# Patient Record
Sex: Male | Born: 1940 | Race: White | Hispanic: No | Marital: Married | Smoking: Former smoker
Health system: Southern US, Community
[De-identification: ages and names within clinical notes are randomized; demographics above are authoritative.]

## PROBLEM LIST (undated history)

## (undated) DIAGNOSIS — E119 Type 2 diabetes mellitus without complications: Secondary | ICD-10-CM

## (undated) DIAGNOSIS — I10 Essential (primary) hypertension: Secondary | ICD-10-CM

## (undated) DIAGNOSIS — M1990 Unspecified osteoarthritis, unspecified site: Secondary | ICD-10-CM

## (undated) DIAGNOSIS — I739 Peripheral vascular disease, unspecified: Secondary | ICD-10-CM

## (undated) DIAGNOSIS — Z8614 Personal history of Methicillin resistant Staphylococcus aureus infection: Secondary | ICD-10-CM

## (undated) HISTORY — PX: ROTATOR CUFF REPAIR: SHX139

## (undated) HISTORY — PX: BACK SURGERY: SHX140

## (undated) HISTORY — PX: VEIN SURGERY: SHX48

## (undated) HISTORY — PX: REPLACEMENT TOTAL KNEE: SUR1224

---

## 2001-05-26 ENCOUNTER — Encounter (HOSPITAL_COMMUNITY): Admission: RE | Admit: 2001-05-26 | Discharge: 2001-06-25 | Payer: Self-pay | Admitting: Rheumatology

## 2001-08-18 ENCOUNTER — Encounter (HOSPITAL_COMMUNITY): Admission: RE | Admit: 2001-08-18 | Discharge: 2001-09-17 | Payer: Self-pay | Admitting: Rheumatology

## 2001-11-10 ENCOUNTER — Encounter (HOSPITAL_COMMUNITY): Admission: RE | Admit: 2001-11-10 | Discharge: 2001-12-10 | Payer: Self-pay | Admitting: Rheumatology

## 2002-02-17 ENCOUNTER — Encounter (HOSPITAL_COMMUNITY): Admission: RE | Admit: 2002-02-17 | Discharge: 2002-03-19 | Payer: Self-pay | Admitting: Rheumatology

## 2002-03-30 ENCOUNTER — Encounter (HOSPITAL_COMMUNITY): Admission: RE | Admit: 2002-03-30 | Discharge: 2002-04-29 | Payer: Self-pay | Admitting: Rheumatology

## 2002-07-20 ENCOUNTER — Encounter (HOSPITAL_COMMUNITY): Admission: RE | Admit: 2002-07-20 | Discharge: 2002-08-19 | Payer: Self-pay | Admitting: Rheumatology

## 2002-10-12 ENCOUNTER — Encounter (HOSPITAL_COMMUNITY): Admission: RE | Admit: 2002-10-12 | Discharge: 2002-11-11 | Payer: Self-pay | Admitting: Rheumatology

## 2002-11-27 ENCOUNTER — Encounter (HOSPITAL_COMMUNITY): Admission: RE | Admit: 2002-11-27 | Discharge: 2002-12-27 | Payer: Self-pay | Admitting: Rheumatology

## 2003-01-12 ENCOUNTER — Encounter (HOSPITAL_COMMUNITY): Admission: RE | Admit: 2003-01-12 | Discharge: 2003-02-11 | Payer: Self-pay | Admitting: Rheumatology

## 2003-05-18 ENCOUNTER — Encounter (HOSPITAL_COMMUNITY): Admission: RE | Admit: 2003-05-18 | Discharge: 2003-06-17 | Payer: Self-pay | Admitting: Family Medicine

## 2003-08-17 ENCOUNTER — Encounter (HOSPITAL_COMMUNITY): Admission: RE | Admit: 2003-08-17 | Discharge: 2003-09-16 | Payer: Self-pay | Admitting: Rheumatology

## 2006-10-06 ENCOUNTER — Encounter: Admission: RE | Admit: 2006-10-06 | Discharge: 2006-10-06 | Payer: Self-pay | Admitting: Specialist

## 2006-11-17 ENCOUNTER — Inpatient Hospital Stay (HOSPITAL_COMMUNITY): Admission: RE | Admit: 2006-11-17 | Discharge: 2006-11-19 | Payer: Self-pay | Admitting: Specialist

## 2007-05-13 IMAGING — CR DG LUMBAR SPINE 2-3V
3 series · 3 of 3 positions shown · non-contrast
Comparison: CT myelogram 10/06/06.

CLINICAL DATA: Preop stenosis.
 LUMBAR SPINE ? 2 VIEW:

[t l-spine a.p.]
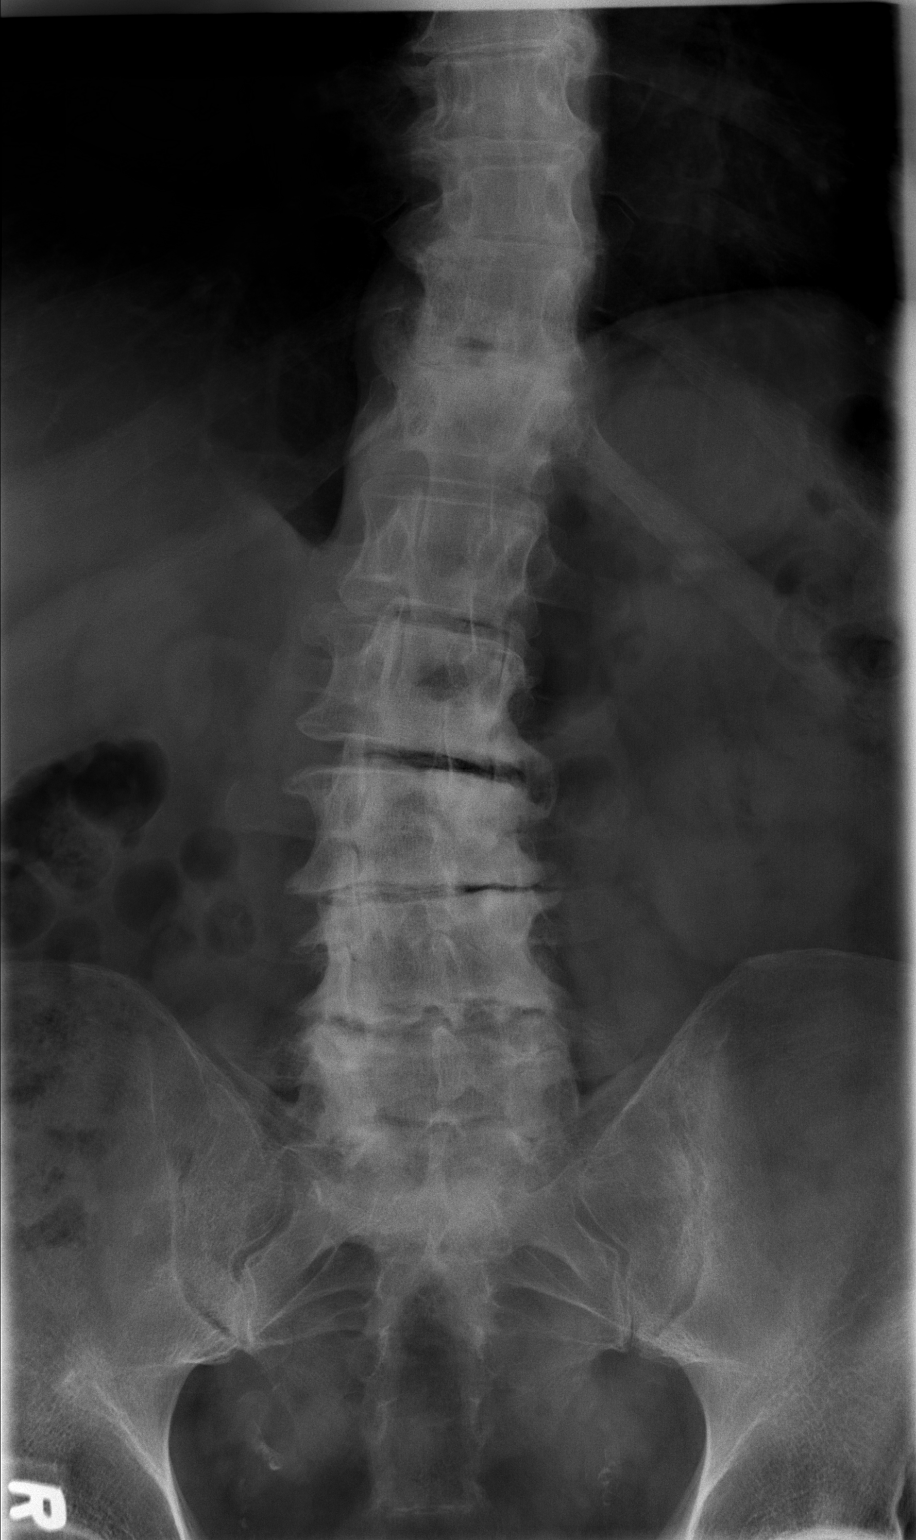

[t l-spine lat]
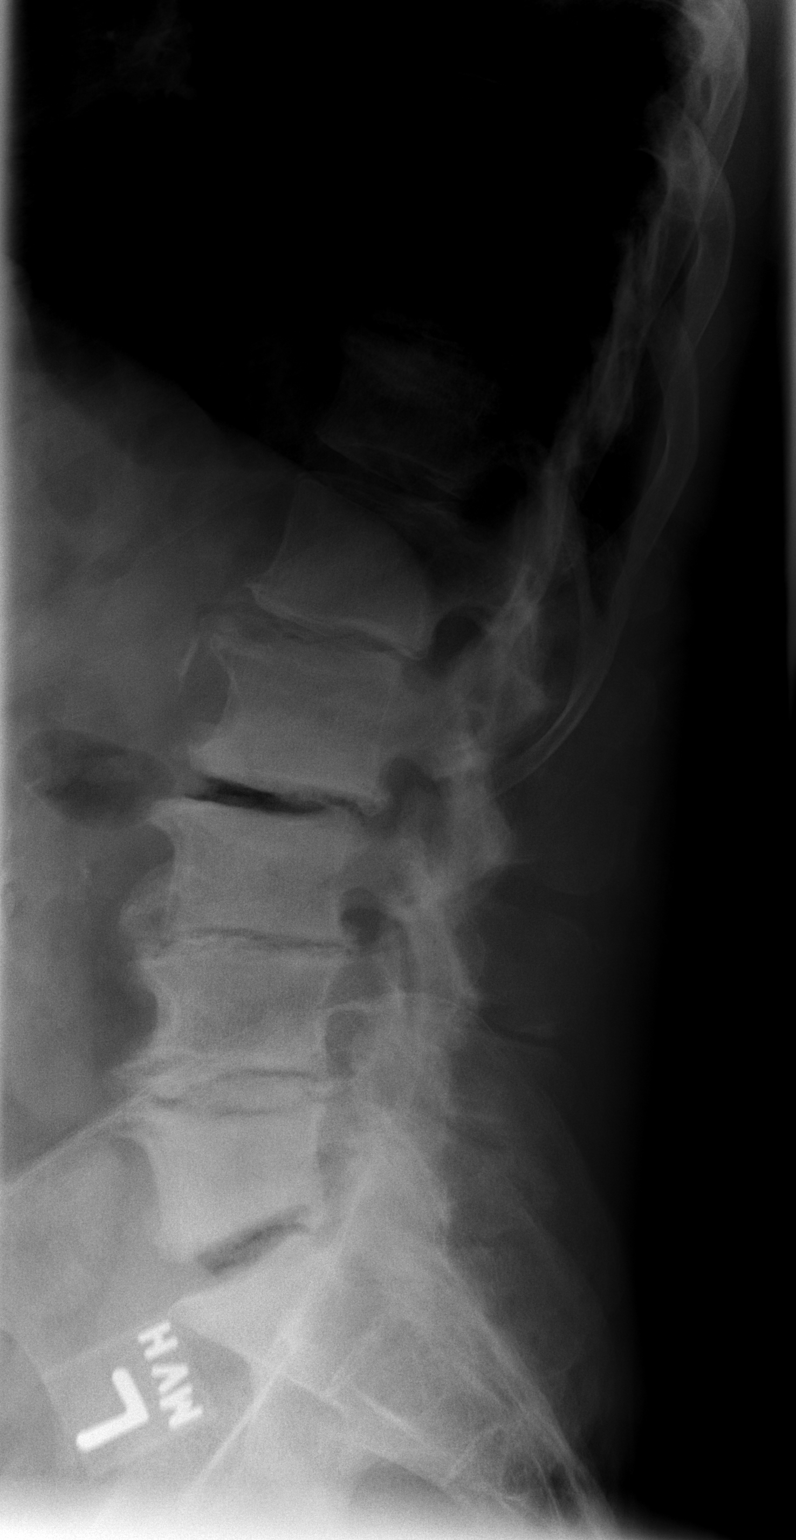

[t l-spine l5-s1 spot]
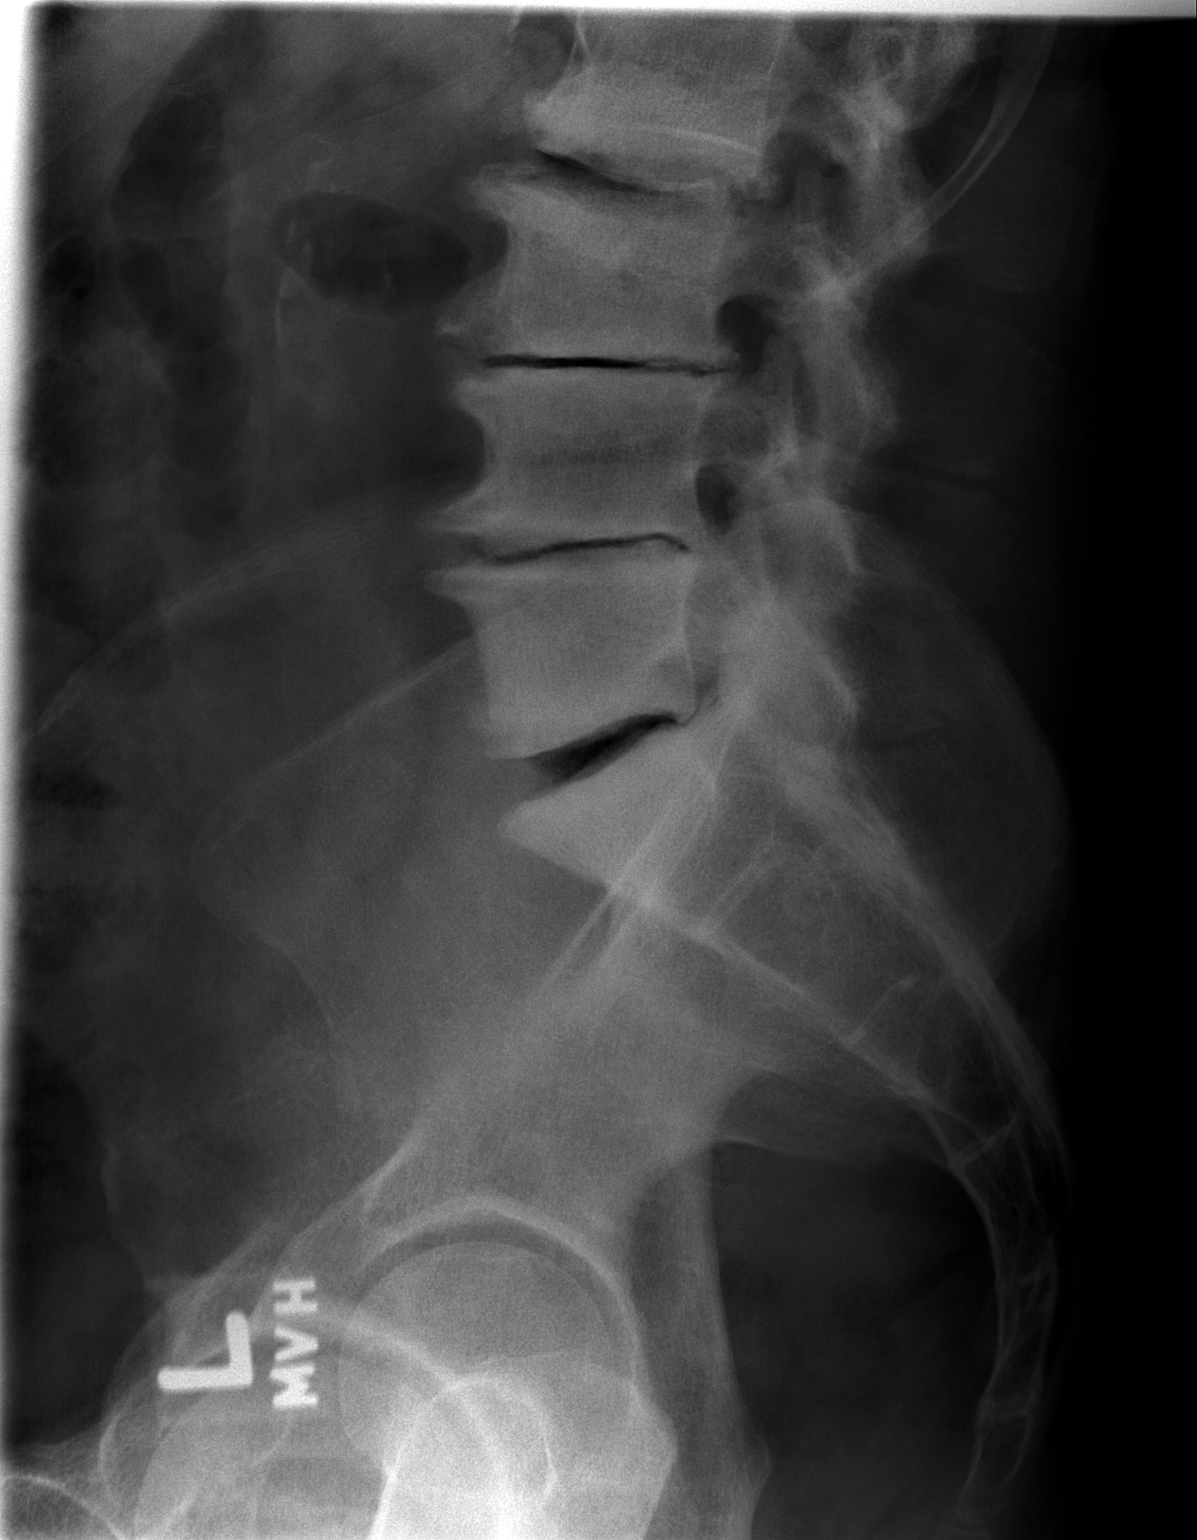

[3 of 3 positions shown; findings below may reference images not displayed]

FINDINGS: Minimal retrolisthesis at the L1-2, L2-3, and L5-S1.  There are marked endplate degenerative changes throughout with loss of disk space height and vacuum disk phenomenon.  Facet hypertrophy is seen as well. Mild right curvature is seen.
IMPRESSION: Severe spondylosis.

## 2010-12-21 ENCOUNTER — Encounter: Payer: Self-pay | Admitting: Family Medicine

## 2012-03-01 ENCOUNTER — Ambulatory Visit: Payer: Self-pay | Admitting: Physical Therapy

## 2012-03-07 ENCOUNTER — Ambulatory Visit: Payer: Medicare Other | Attending: Family Medicine | Admitting: Physical Therapy

## 2012-03-07 DIAGNOSIS — R5381 Other malaise: Secondary | ICD-10-CM | POA: Insufficient documentation

## 2012-03-07 DIAGNOSIS — M545 Low back pain, unspecified: Secondary | ICD-10-CM | POA: Insufficient documentation

## 2012-03-07 DIAGNOSIS — M2560 Stiffness of unspecified joint, not elsewhere classified: Secondary | ICD-10-CM | POA: Insufficient documentation

## 2012-03-07 DIAGNOSIS — IMO0001 Reserved for inherently not codable concepts without codable children: Secondary | ICD-10-CM | POA: Insufficient documentation

## 2012-03-11 ENCOUNTER — Ambulatory Visit: Payer: Medicare Other | Admitting: Physical Therapy

## 2012-08-08 ENCOUNTER — Other Ambulatory Visit: Payer: Self-pay | Admitting: Rheumatology

## 2012-08-08 DIAGNOSIS — IMO0002 Reserved for concepts with insufficient information to code with codable children: Secondary | ICD-10-CM

## 2012-08-11 ENCOUNTER — Other Ambulatory Visit: Payer: Self-pay | Admitting: Rheumatology

## 2012-08-11 ENCOUNTER — Ambulatory Visit
Admission: RE | Admit: 2012-08-11 | Discharge: 2012-08-11 | Disposition: A | Discharge status: Home / Self Care | Payer: Medicare Other | Source: Ambulatory Visit | Attending: Rheumatology | Admitting: Rheumatology

## 2012-08-11 DIAGNOSIS — IMO0002 Reserved for concepts with insufficient information to code with codable children: Secondary | ICD-10-CM

## 2012-08-15 ENCOUNTER — Other Ambulatory Visit: Payer: Self-pay | Admitting: Rheumatology

## 2012-08-15 DIAGNOSIS — M79651 Pain in right thigh: Secondary | ICD-10-CM

## 2012-08-15 DIAGNOSIS — M5416 Radiculopathy, lumbar region: Secondary | ICD-10-CM

## 2012-08-17 ENCOUNTER — Ambulatory Visit
Admission: RE | Admit: 2012-08-17 | Discharge: 2012-08-17 | Disposition: A | Discharge status: Home / Self Care | Payer: Medicare Other | Source: Ambulatory Visit | Attending: Rheumatology | Admitting: Rheumatology

## 2012-08-17 VITALS — BP 183/86 | HR 79 | Ht 72.0 in | Wt 210.0 lb

## 2012-08-17 DIAGNOSIS — M5416 Radiculopathy, lumbar region: Secondary | ICD-10-CM

## 2012-08-17 DIAGNOSIS — M79651 Pain in right thigh: Secondary | ICD-10-CM

## 2012-08-17 MED ORDER — IOHEXOL 180 MG/ML  SOLN
1.0000 mL | Freq: Once | INTRAMUSCULAR | Status: AC | PRN
  Administered 2012-08-17: 1 mL via EPIDURAL

## 2012-08-17 MED ORDER — METHYLPREDNISOLONE ACETATE 40 MG/ML INJ SUSP (RADIOLOG
120.0000 mg | Freq: Once | INTRAMUSCULAR | Status: AC
  Administered 2012-08-17: 120 mg via EPIDURAL

## 2012-09-14 ENCOUNTER — Encounter (HOSPITAL_BASED_OUTPATIENT_CLINIC_OR_DEPARTMENT_OTHER): Payer: Medicare Other | Attending: General Surgery

## 2012-09-14 DIAGNOSIS — E119 Type 2 diabetes mellitus without complications: Secondary | ICD-10-CM | POA: Insufficient documentation

## 2012-09-14 DIAGNOSIS — L97409 Non-pressure chronic ulcer of unspecified heel and midfoot with unspecified severity: Secondary | ICD-10-CM | POA: Insufficient documentation

## 2012-09-14 DIAGNOSIS — Z79899 Other long term (current) drug therapy: Secondary | ICD-10-CM | POA: Insufficient documentation

## 2012-09-14 DIAGNOSIS — L97209 Non-pressure chronic ulcer of unspecified calf with unspecified severity: Secondary | ICD-10-CM | POA: Insufficient documentation

## 2012-09-14 DIAGNOSIS — Z794 Long term (current) use of insulin: Secondary | ICD-10-CM | POA: Insufficient documentation

## 2012-09-14 NOTE — Progress Notes (Signed)
[facility]  NAME:  Craig Woodard, Craig Woodard NO.:  [id number]  MEDICAL RECORD NO.:  [id number]      DATE OF BIRTH:  10-Sep-1941  PHYSICIAN:  Ardath Sax, M.D.           VISIT DATE:                                  OFFICE VISIT   He is a 71 year old diabetic man who comes to [place] because of a persistent ulcer on his right heel and one also on his right calf.  The one on his heel is about 2.5 cm in diameter.  The one on his calf is a dry area that looks like a scab on it.  It is only 0.6 cm in diameter.  The leg has been x-rayed and there is no findings to make [place] worry about any osteomyelitis.  He has no fever.  His blood pressure is 160/90, temperature 98.6, pulse is 80.  He weighs 212 pounds.  He feels like this ulcer came because of ill fitting pair of shoes and he developed a blister and now he has frank ulcers going down in the subcutaneous tissue.  Today, I debrided this with a knife and pickups and I got it down to some bleeding tissue, and we are planning on to treat it with Santyl for now and I really did not feel any pulses.  So we are getting vascular studies on him and we are going to apply for a Dermagraft and we will see what we can do with a Dermagraft if we get a clean enough with the Santyl and multiple debridements.  He is on quite a few medicines including glipizide for his diabetes and Actos and metformin. He takes carvedilol for his blood pressure, also benazepril and amlodipine and he takes insulin also along with Lipitor.  He also takes hydrocodone for pain.  So we are going to see him back here in a week. I know he will need further debridement and hopefully will get him okayed for Dermagraft which we can may be used in another week or two.     Ardath Sax, M.D.     PP/MEDQ  D:  09/14/2012  T:  09/14/2012  Job:  [id number]

## 2012-10-05 ENCOUNTER — Encounter (HOSPITAL_BASED_OUTPATIENT_CLINIC_OR_DEPARTMENT_OTHER): Payer: Medicare Other

## 2012-10-05 ENCOUNTER — Encounter (HOSPITAL_BASED_OUTPATIENT_CLINIC_OR_DEPARTMENT_OTHER): Payer: Medicare Other | Attending: General Surgery

## 2012-10-05 DIAGNOSIS — L97809 Non-pressure chronic ulcer of other part of unspecified lower leg with unspecified severity: Secondary | ICD-10-CM | POA: Insufficient documentation

## 2012-10-05 DIAGNOSIS — L97409 Non-pressure chronic ulcer of unspecified heel and midfoot with unspecified severity: Secondary | ICD-10-CM | POA: Insufficient documentation

## 2012-10-05 DIAGNOSIS — E1169 Type 2 diabetes mellitus with other specified complication: Secondary | ICD-10-CM | POA: Insufficient documentation

## 2012-10-19 ENCOUNTER — Encounter (HOSPITAL_BASED_OUTPATIENT_CLINIC_OR_DEPARTMENT_OTHER): Payer: Medicare Other

## 2012-11-02 ENCOUNTER — Encounter (HOSPITAL_BASED_OUTPATIENT_CLINIC_OR_DEPARTMENT_OTHER): Payer: Medicare Other | Attending: General Surgery

## 2012-11-02 DIAGNOSIS — L97409 Non-pressure chronic ulcer of unspecified heel and midfoot with unspecified severity: Secondary | ICD-10-CM | POA: Insufficient documentation

## 2012-11-02 DIAGNOSIS — L97809 Non-pressure chronic ulcer of other part of unspecified lower leg with unspecified severity: Secondary | ICD-10-CM | POA: Insufficient documentation

## 2012-11-02 DIAGNOSIS — E1169 Type 2 diabetes mellitus with other specified complication: Secondary | ICD-10-CM | POA: Insufficient documentation

## 2012-12-07 ENCOUNTER — Encounter (HOSPITAL_BASED_OUTPATIENT_CLINIC_OR_DEPARTMENT_OTHER): Payer: Medicare Other | Attending: General Surgery

## 2012-12-07 DIAGNOSIS — L97409 Non-pressure chronic ulcer of unspecified heel and midfoot with unspecified severity: Secondary | ICD-10-CM | POA: Insufficient documentation

## 2012-12-07 DIAGNOSIS — E1169 Type 2 diabetes mellitus with other specified complication: Secondary | ICD-10-CM | POA: Insufficient documentation

## 2012-12-07 DIAGNOSIS — L97809 Non-pressure chronic ulcer of other part of unspecified lower leg with unspecified severity: Secondary | ICD-10-CM | POA: Insufficient documentation

## 2013-01-04 ENCOUNTER — Encounter (HOSPITAL_BASED_OUTPATIENT_CLINIC_OR_DEPARTMENT_OTHER): Payer: Medicare Other | Attending: General Surgery

## 2013-01-04 DIAGNOSIS — L97509 Non-pressure chronic ulcer of other part of unspecified foot with unspecified severity: Secondary | ICD-10-CM | POA: Insufficient documentation

## 2013-01-04 DIAGNOSIS — E1169 Type 2 diabetes mellitus with other specified complication: Secondary | ICD-10-CM | POA: Insufficient documentation

## 2013-01-04 NOTE — Progress Notes (Signed)
[facility]  NAME:  LARONE, KLIETHERMES NO.:  [id number]  MEDICAL RECORD NO.:  [id number]      DATE OF BIRTH:  September 03, 1941  PHYSICIAN:  Ardath Sax, M.D.           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  We have done multiple debridements.  We have used Santyl.  We have used collagen.  We have used silver alginate.  We are offloading it.  Today, I just debrided it again, it is still about 3 cm in diameter and it worries me that is going close to bone.  I am going to put him on doxycycline and will continue to offload and I will treat him with silver alginate and Santyl, but I would like to apply for hyperbaric oxygen treatments that I think would be of great help and if we can get this cleaned up, I would also like to use Dermagraft.  So, we are applying for both HBO and Dermagraft and we will continue to treat him weekly here with Santyl and alginate and for this wound Wagner 3 diabetic foot ulcer.     Ardath Sax, M.D.     PP/MEDQ  D:  01/04/2013  T:  01/04/2013  Job:  [id number]

## 2013-01-11 ENCOUNTER — Encounter (HOSPITAL_BASED_OUTPATIENT_CLINIC_OR_DEPARTMENT_OTHER): Payer: Medicare Other

## 2013-02-01 ENCOUNTER — Encounter (HOSPITAL_BASED_OUTPATIENT_CLINIC_OR_DEPARTMENT_OTHER): Payer: Medicare Other | Attending: General Surgery

## 2013-02-01 DIAGNOSIS — E1169 Type 2 diabetes mellitus with other specified complication: Secondary | ICD-10-CM | POA: Insufficient documentation

## 2013-02-01 DIAGNOSIS — L97409 Non-pressure chronic ulcer of unspecified heel and midfoot with unspecified severity: Secondary | ICD-10-CM | POA: Insufficient documentation

## 2013-03-01 ENCOUNTER — Encounter (HOSPITAL_BASED_OUTPATIENT_CLINIC_OR_DEPARTMENT_OTHER): Payer: Medicare Other | Attending: General Surgery

## 2013-03-01 DIAGNOSIS — I96 Gangrene, not elsewhere classified: Secondary | ICD-10-CM | POA: Insufficient documentation

## 2013-03-01 DIAGNOSIS — E1159 Type 2 diabetes mellitus with other circulatory complications: Secondary | ICD-10-CM | POA: Insufficient documentation

## 2013-03-01 DIAGNOSIS — E1169 Type 2 diabetes mellitus with other specified complication: Secondary | ICD-10-CM | POA: Insufficient documentation

## 2013-03-01 DIAGNOSIS — L97409 Non-pressure chronic ulcer of unspecified heel and midfoot with unspecified severity: Secondary | ICD-10-CM | POA: Insufficient documentation

## 2013-03-01 DIAGNOSIS — L97509 Non-pressure chronic ulcer of other part of unspecified foot with unspecified severity: Secondary | ICD-10-CM | POA: Insufficient documentation

## 2013-03-07 ENCOUNTER — Other Ambulatory Visit: Payer: Self-pay | Admitting: *Deleted

## 2013-03-07 ENCOUNTER — Ambulatory Visit (INDEPENDENT_AMBULATORY_CARE_PROVIDER_SITE_OTHER): Payer: Medicare Other

## 2013-03-07 ENCOUNTER — Other Ambulatory Visit: Payer: Self-pay

## 2013-03-07 DIAGNOSIS — S91109D Unspecified open wound of unspecified toe(s) without damage to nail, subsequent encounter: Secondary | ICD-10-CM

## 2013-03-07 DIAGNOSIS — Z5189 Encounter for other specified aftercare: Secondary | ICD-10-CM

## 2013-03-07 DIAGNOSIS — S91109A Unspecified open wound of unspecified toe(s) without damage to nail, initial encounter: Secondary | ICD-10-CM

## 2013-03-07 NOTE — Progress Notes (Signed)
Patient walks in today with an order from Ardath Sax, MD at the [facility] for an x-ray of his right 5th toe to r/u osteo.

## 2013-03-08 ENCOUNTER — Inpatient Hospital Stay (HOSPITAL_COMMUNITY)
Admission: EM | Admit: 2013-03-08 | Discharge: 2013-03-14 | DRG: 617 | Disposition: A | Discharge status: Skilled Nursing Facility | Payer: Medicare Other | Attending: Internal Medicine | Admitting: Internal Medicine

## 2013-03-08 ENCOUNTER — Emergency Department (HOSPITAL_COMMUNITY): Payer: Medicare Other

## 2013-03-08 ENCOUNTER — Encounter (HOSPITAL_COMMUNITY): Payer: Self-pay | Admitting: Emergency Medicine

## 2013-03-08 DIAGNOSIS — L98499 Non-pressure chronic ulcer of skin of other sites with unspecified severity: Secondary | ICD-10-CM

## 2013-03-08 DIAGNOSIS — L97509 Non-pressure chronic ulcer of other part of unspecified foot with unspecified severity: Secondary | ICD-10-CM

## 2013-03-08 DIAGNOSIS — E119 Type 2 diabetes mellitus without complications: Secondary | ICD-10-CM | POA: Diagnosis present

## 2013-03-08 DIAGNOSIS — K5900 Constipation, unspecified: Secondary | ICD-10-CM | POA: Diagnosis present

## 2013-03-08 DIAGNOSIS — M869 Osteomyelitis, unspecified: Secondary | ICD-10-CM | POA: Diagnosis present

## 2013-03-08 DIAGNOSIS — I798 Other disorders of arteries, arterioles and capillaries in diseases classified elsewhere: Secondary | ICD-10-CM | POA: Diagnosis present

## 2013-03-08 DIAGNOSIS — L97514 Non-pressure chronic ulcer of other part of right foot with necrosis of bone: Secondary | ICD-10-CM

## 2013-03-08 DIAGNOSIS — E1169 Type 2 diabetes mellitus with other specified complication: Principal | ICD-10-CM | POA: Diagnosis present

## 2013-03-08 DIAGNOSIS — L02419 Cutaneous abscess of limb, unspecified: Secondary | ICD-10-CM | POA: Diagnosis present

## 2013-03-08 DIAGNOSIS — Z794 Long term (current) use of insulin: Secondary | ICD-10-CM

## 2013-03-08 DIAGNOSIS — L89609 Pressure ulcer of unspecified heel, unspecified stage: Secondary | ICD-10-CM | POA: Diagnosis present

## 2013-03-08 DIAGNOSIS — I739 Peripheral vascular disease, unspecified: Secondary | ICD-10-CM

## 2013-03-08 DIAGNOSIS — I10 Essential (primary) hypertension: Secondary | ICD-10-CM | POA: Diagnosis present

## 2013-03-08 DIAGNOSIS — R739 Hyperglycemia, unspecified: Secondary | ICD-10-CM

## 2013-03-08 DIAGNOSIS — L02619 Cutaneous abscess of unspecified foot: Secondary | ICD-10-CM | POA: Diagnosis present

## 2013-03-08 DIAGNOSIS — E785 Hyperlipidemia, unspecified: Secondary | ICD-10-CM | POA: Diagnosis present

## 2013-03-08 DIAGNOSIS — L8990 Pressure ulcer of unspecified site, unspecified stage: Secondary | ICD-10-CM | POA: Diagnosis present

## 2013-03-08 DIAGNOSIS — E1159 Type 2 diabetes mellitus with other circulatory complications: Secondary | ICD-10-CM | POA: Diagnosis present

## 2013-03-08 DIAGNOSIS — M9080 Osteopathy in diseases classified elsewhere, unspecified site: Secondary | ICD-10-CM | POA: Diagnosis present

## 2013-03-08 DIAGNOSIS — Z96659 Presence of unspecified artificial knee joint: Secondary | ICD-10-CM

## 2013-03-08 DIAGNOSIS — M069 Rheumatoid arthritis, unspecified: Secondary | ICD-10-CM | POA: Diagnosis present

## 2013-03-08 DIAGNOSIS — L03115 Cellulitis of right lower limb: Secondary | ICD-10-CM

## 2013-03-08 DIAGNOSIS — R7309 Other abnormal glucose: Secondary | ICD-10-CM

## 2013-03-08 DIAGNOSIS — S98139A Complete traumatic amputation of one unspecified lesser toe, initial encounter: Secondary | ICD-10-CM

## 2013-03-08 DIAGNOSIS — Z87891 Personal history of nicotine dependence: Secondary | ICD-10-CM

## 2013-03-08 DIAGNOSIS — L03119 Cellulitis of unspecified part of limb: Secondary | ICD-10-CM | POA: Diagnosis present

## 2013-03-08 DIAGNOSIS — I96 Gangrene, not elsewhere classified: Secondary | ICD-10-CM | POA: Diagnosis present

## 2013-03-08 HISTORY — DX: Essential (primary) hypertension: I10

## 2013-03-08 HISTORY — DX: Type 2 diabetes mellitus without complications: E11.9

## 2013-03-08 HISTORY — PX: TOE AMPUTATION: SHX809

## 2013-03-08 HISTORY — DX: Unspecified osteoarthritis, unspecified site: M19.90

## 2013-03-08 LAB — GLUCOSE, CAPILLARY: Glucose-Capillary: 118 mg/dL — ABNORMAL HIGH (ref 70–99)

## 2013-03-08 LAB — CBC WITH DIFFERENTIAL/PLATELET
Hemoglobin: 10.8 g/dL — ABNORMAL LOW (ref 13.0–17.0)
Lymphocytes Relative: 13 % (ref 12–46)
Lymphs Abs: 1 10*3/uL (ref 0.7–4.0)
MCH: 27.7 pg (ref 26.0–34.0)
MCHC: 34 g/dL (ref 30.0–36.0)
MCV: 81.5 fL (ref 78.0–100.0)
Monocytes Relative: 6 % (ref 3–12)
Neutro Abs: 6.6 10*3/uL (ref 1.7–7.7)
Neutrophils Relative %: 81 % — ABNORMAL HIGH (ref 43–77)
RDW: 14.7 % (ref 11.5–15.5)

## 2013-03-08 LAB — POCT I-STAT, CHEM 8
BUN: 11 mg/dL (ref 6–23)
Calcium, Ion: 1.08 mmol/L — ABNORMAL LOW (ref 1.13–1.30)
Chloride: 101 mEq/L (ref 96–112)
Glucose, Bld: 282 mg/dL — ABNORMAL HIGH (ref 70–99)
HCT: 36 % — ABNORMAL LOW (ref 39.0–52.0)
TCO2: 28 mmol/L (ref 0–100)

## 2013-03-08 MED ORDER — BENAZEPRIL HCL 40 MG PO TABS
40.0000 mg | ORAL_TABLET | Freq: Every day | ORAL | Status: DC
  Administered 2013-03-08 – 2013-03-14 (×6): 40 mg via ORAL
  Filled 2013-03-08 (×7): qty 1

## 2013-03-08 MED ORDER — INSULIN ASPART 100 UNIT/ML ~~LOC~~ SOLN
0.0000 [IU] | Freq: Three times a day (TID) | SUBCUTANEOUS | Status: DC
  Administered 2013-03-08: 5 [IU] via SUBCUTANEOUS
  Administered 2013-03-09: 8 [IU] via SUBCUTANEOUS
  Administered 2013-03-09 – 2013-03-10 (×3): 3 [IU] via SUBCUTANEOUS
  Administered 2013-03-11: 2 [IU] via SUBCUTANEOUS
  Administered 2013-03-11: 5 [IU] via SUBCUTANEOUS
  Administered 2013-03-12: 3 [IU] via SUBCUTANEOUS
  Administered 2013-03-12: 2 [IU] via SUBCUTANEOUS
  Administered 2013-03-12: 8 [IU] via SUBCUTANEOUS
  Administered 2013-03-13 (×2): 2 [IU] via SUBCUTANEOUS

## 2013-03-08 MED ORDER — CARVEDILOL 25 MG PO TABS
50.0000 mg | ORAL_TABLET | Freq: Two times a day (BID) | ORAL | Status: DC
  Administered 2013-03-08 – 2013-03-14 (×11): 50 mg via ORAL
  Filled 2013-03-08 (×15): qty 2

## 2013-03-08 MED ORDER — CLOPIDOGREL BISULFATE 75 MG PO TABS
75.0000 mg | ORAL_TABLET | Freq: Every day | ORAL | Status: DC
  Administered 2013-03-08 – 2013-03-14 (×6): 75 mg via ORAL
  Filled 2013-03-08 (×7): qty 1

## 2013-03-08 MED ORDER — LEFLUNOMIDE 20 MG PO TABS
20.0000 mg | ORAL_TABLET | Freq: Every day | ORAL | Status: DC
  Administered 2013-03-08 – 2013-03-14 (×6): 20 mg via ORAL
  Filled 2013-03-08 (×7): qty 1

## 2013-03-08 MED ORDER — HYDROCODONE-ACETAMINOPHEN 5-325 MG PO TABS
1.0000 | ORAL_TABLET | Freq: Four times a day (QID) | ORAL | Status: DC | PRN
  Administered 2013-03-08 (×2): 1 via ORAL
  Administered 2013-03-09 (×2): 2 via ORAL
  Filled 2013-03-08: qty 1
  Filled 2013-03-08: qty 2
  Filled 2013-03-08: qty 1
  Filled 2013-03-08 (×4): qty 2

## 2013-03-08 MED ORDER — ASPIRIN 81 MG PO CHEW
81.0000 mg | CHEWABLE_TABLET | Freq: Every day | ORAL | Status: DC
  Administered 2013-03-08 – 2013-03-11 (×3): 81 mg via ORAL
  Filled 2013-03-08 (×4): qty 1

## 2013-03-08 MED ORDER — ONDANSETRON HCL 4 MG/2ML IJ SOLN
4.0000 mg | Freq: Once | INTRAMUSCULAR | Status: AC
  Administered 2013-03-08: 4 mg via INTRAVENOUS
  Filled 2013-03-08: qty 2

## 2013-03-08 MED ORDER — PIPERACILLIN-TAZOBACTAM 3.375 G IVPB
3.3750 g | Freq: Three times a day (TID) | INTRAVENOUS | Status: DC
  Administered 2013-03-08 – 2013-03-13 (×15): 3.375 g via INTRAVENOUS
  Filled 2013-03-08 (×16): qty 50

## 2013-03-08 MED ORDER — SIMVASTATIN 40 MG PO TABS
40.0000 mg | ORAL_TABLET | Freq: Every evening | ORAL | Status: DC
  Administered 2013-03-08: 40 mg via ORAL
  Filled 2013-03-08 (×2): qty 1

## 2013-03-08 MED ORDER — AMLODIPINE BESYLATE 10 MG PO TABS
10.0000 mg | ORAL_TABLET | Freq: Every day | ORAL | Status: DC
  Administered 2013-03-08 – 2013-03-14 (×6): 10 mg via ORAL
  Filled 2013-03-08 (×7): qty 1

## 2013-03-08 MED ORDER — ACETAMINOPHEN 650 MG RE SUPP: 650.0000 mg | Freq: Four times a day (QID) | RECTAL | Status: DC | PRN

## 2013-03-08 MED ORDER — HEPARIN SODIUM (PORCINE) 5000 UNIT/ML IJ SOLN
5000.0000 [IU] | Freq: Three times a day (TID) | INTRAMUSCULAR | Status: DC
  Administered 2013-03-08 – 2013-03-14 (×15): 5000 [IU] via SUBCUTANEOUS
  Filled 2013-03-08 (×20): qty 1

## 2013-03-08 MED ORDER — DOXAZOSIN MESYLATE 8 MG PO TABS
8.0000 mg | ORAL_TABLET | Freq: Every day | ORAL | Status: DC
  Administered 2013-03-08 – 2013-03-13 (×6): 8 mg via ORAL
  Filled 2013-03-08 (×7): qty 1

## 2013-03-08 MED ORDER — VANCOMYCIN HCL IN DEXTROSE 1-5 GM/200ML-% IV SOLN
1000.0000 mg | Freq: Two times a day (BID) | INTRAVENOUS | Status: DC
  Administered 2013-03-09 – 2013-03-13 (×9): 1000 mg via INTRAVENOUS
  Filled 2013-03-08 (×10): qty 200

## 2013-03-08 MED ORDER — MORPHINE SULFATE 4 MG/ML IJ SOLN
2.0000 mg | Freq: Once | INTRAMUSCULAR | Status: AC
  Administered 2013-03-08: 2 mg via INTRAVENOUS
  Filled 2013-03-08: qty 1

## 2013-03-08 MED ORDER — ACETAMINOPHEN 325 MG PO TABS
650.0000 mg | ORAL_TABLET | Freq: Four times a day (QID) | ORAL | Status: DC | PRN
  Administered 2013-03-08 – 2013-03-11 (×3): 650 mg via ORAL
  Filled 2013-03-08 (×3): qty 2

## 2013-03-08 MED ORDER — INSULIN DETEMIR 100 UNIT/ML ~~LOC~~ SOLN
25.0000 [IU] | Freq: Every day | SUBCUTANEOUS | Status: DC
  Administered 2013-03-08 – 2013-03-11 (×4): 25 [IU] via SUBCUTANEOUS
  Filled 2013-03-08 (×5): qty 0.25

## 2013-03-08 MED ORDER — ASPIRIN 81 MG PO TABS: 81.0000 mg | ORAL_TABLET | Freq: Every day | ORAL | Status: DC

## 2013-03-08 MED ORDER — VANCOMYCIN HCL IN DEXTROSE 1-5 GM/200ML-% IV SOLN
1000.0000 mg | Freq: Once | INTRAVENOUS | Status: AC
  Administered 2013-03-08: 1000 mg via INTRAVENOUS
  Filled 2013-03-08: qty 200

## 2013-03-08 MED ORDER — TRAMADOL HCL 50 MG PO TABS
50.0000 mg | ORAL_TABLET | Freq: Four times a day (QID) | ORAL | Status: DC | PRN
  Administered 2013-03-08 – 2013-03-10 (×3): 50 mg via ORAL
  Filled 2013-03-08 (×3): qty 1

## 2013-03-08 NOTE — ED Notes (Signed)
Pt reports his r little toe was amputated at the wound center around 11a today.  Reports that wound dr sent him over to have wound stitched closed.

## 2013-03-08 NOTE — Progress Notes (Signed)
Patient ID: Craig Woodard, male   DOB: June 04, 1941, 72 y.o.   MRN: [id number] Asked to see the patient for evaluation of lower surety arterial insufficiency. The patient had a prior endovascular treatments at [facility]. He had specific and stenting related to the arterial insufficiency in his right foot and right fifth toe ulceration. He is unclear as to the results or the exact treatment that he had at that place. We were asked to see the determine if he had adequate arterial flow. He underwent outpatient debridement and possible amputation date the wound center according to history. He presented to the [facility] emergency room after this procedure because he felt that he was unable to to care for this.  Past Medical History  Diagnosis Date  . Diabetes mellitus without complication   . Arthritis   . Hypertension     History  Substance Use Topics  . Smoking status: Former Smoker -- 1.50 packs/day for 22 years    Types: Cigarettes    Start date: 08/18/1955    Quit date: 08/17/1977  . Smokeless tobacco: Never Used  . Alcohol Use: No    History reviewed. No pertinent family history.  No Known Allergies  Current facility-administered medications:acetaminophen (TYLENOL) suppository 650 mg, 650 mg, Rectal, Q6H PRN, Pamella Pert, MD;  acetaminophen (TYLENOL) tablet 650 mg, 650 mg, Oral, Q6H PRN, Pamella Pert, MD, 650 mg at 03/08/13 1852;  amLODipine (NORVASC) tablet 10 mg, 10 mg, Oral, Daily, Pamella Pert, MD, 10 mg at 03/08/13 1738;  aspirin chewable tablet 81 mg, 81 mg, Oral, Daily, Pamella Pert, MD, 81 mg at 03/08/13 1738 benazepril (LOTENSIN) tablet 40 mg, 40 mg, Oral, Daily, Pamella Pert, MD, 40 mg at 03/08/13 1738;  carvedilol (COREG) tablet 50 mg, 50 mg, Oral, BID WC, Pamella Pert, MD, 50 mg at 03/08/13 1738;  clopidogrel (PLAVIX) tablet 75 mg, 75 mg, Oral, Daily, Pamella Pert, MD, 75 mg at 03/08/13 1738;  doxazosin (CARDURA) tablet 8 mg, 8 mg, Oral, QHS,  Costin Gherghe, MD;  heparin injection 5,000 Units, 5,000 Units, Subcutaneous, Q8H, Costin Gherghe, MD HYDROcodone-acetaminophen (NORCO/VICODIN) 5-325 MG per tablet 1-2 tablet, 1-2 tablet, Oral, Q6H PRN, Pamella Pert, MD;  insulin aspart (novoLOG) injection 0-15 Units, 0-15 Units, Subcutaneous, TID WC, Pamella Pert, MD, 5 Units at 03/08/13 1750;  insulin detemir (LEVEMIR) injection 25 Units, 25 Units, Subcutaneous, QHS, Costin Gherghe, MD;  leflunomide (ARAVA) tablet 20 mg, 20 mg, Oral, Daily, Pamella Pert, MD, 20 mg at 03/08/13 1739 piperacillin-tazobactam (ZOSYN) IVPB 3.375 g, 3.375 g, Intravenous, Q8H, Terri L Green, RPH, 3.375 g at 03/08/13 1613;  simvastatin (ZOCOR) tablet 40 mg, 40 mg, Oral, QPM, Pamella Pert, MD, 40 mg at 03/08/13 1738;  traMADol (ULTRAM) tablet 50 mg, 50 mg, Oral, Q6H PRN, Pamella Pert, MD, 50 mg at 03/08/13 1647;  [START ON 03/09/2013] vancomycin (VANCOCIN) IVPB 1000 mg/200 mL premix, 1,000 mg, Intravenous, Q12H, Terri L Green, RPH  BP 171/78  Pulse 85  Temp(Src) 98.6 F (37 C) (Oral)  Resp 16  Ht 6' (1.829 m)  Wt 202 lb 9.6 oz (91.899 kg)  BMI 27.47 kg/m2  SpO2 98%  Body mass index is 27.47 kg/(m^2).       Review of systems with nothing to add other than his history and physical  Does go exam alert oriented in no acute distress All status 2+ radial pulses bilaterally 2+ femoral pulses. 2+ popliteal pulses. The patient has a 2+ anterior tibial pulse at the level of his ankle  on the right and a 2+ dorsalis pedis pulses on his left foot. He has a dressing intact from a dictation earlier today and I did not remove his dressing Neurologically he is grossly intact Respirations are equal and nonlabored Abdomen soft nontender no masses noted   Impression and plan normal arterial flow to the level of the ankle on the right leg and into the foot on the left leg. No need for further evaluation or specific studies related to this. Will not follow actively.  Please call if we can assist.

## 2013-03-08 NOTE — H&P (Signed)
[facility] History and Physical  Craig Woodard ZOX:[id number] DOB: 08-28-1941 DOA: 03/08/2013  PCP: Redmond Baseman, MD  Specialists: Dr. Arbie Cookey  Chief Complaint: right 5th toe amputation  HPI: Craig Woodard is a 72 y.o. male has a past medical history significant for insulin-dependent diabetes, severe peripheral vascular disease status post 2 stents, presented to the emergency room after being seen today in the wound care clinic (Dr. Jimmey Ralph) where he had his right fifth toe amputated. Over the last couple weeks his toe starting getting black. He denies his right foot getting cold, and he endorses much better blood flow in the right foot also stents. Dr. Jimmey Ralph appreciated that the toe was gangrenous, and was partially amputated, and patient was sent here for IV antibiotics and orthopedics consultation. Patient denies any fever or chills. He denies any abdominal pain, nausea, vomiting or diarrhea. He denies any lightheadedness or dizziness. He has no chest pain.  Review of Systems: As per history of present illness, otherwise negative  Past Medical History  Diagnosis Date  . Diabetes mellitus without complication   . Arthritis   . Hypertension    Past Surgical History  Procedure Laterality Date  . Toe amputation    . Replacement total knee    . Back surgery    . Vein surgery    . Rotator cuff repair     Social History:  reports that he quit smoking about 35 years ago. His smoking use included Cigarettes. He started smoking about 57 years ago. He has a 33 pack-year smoking history. He has never used smokeless tobacco. He reports that he does not drink alcohol or use illicit drugs.  No Known Allergies  Family history noncontributory.  Prior to Admission medications   Medication Sig Start Date End Date Taking? Authorizing Provider  amLODipine (NORVASC) 10 MG tablet Take 10 mg by mouth daily.   Yes Historical Provider, MD  aspirin 81 MG tablet Take 81 mg by mouth daily.    Yes Historical Provider, MD  Atorvastatin Calcium (LIPITOR PO) Take 1 tablet by mouth daily. Pt doesn't know the dosage of medication. [facility]   Yes Historical Provider, MD  benazepril (LOTENSIN) 40 MG tablet Take 40 mg by mouth daily.   Yes Historical Provider, MD  carvedilol (COREG) 25 MG tablet Take 50 mg by mouth 2 (two) times daily with a meal.   Yes Historical Provider, MD  clopidogrel (PLAVIX) 75 MG tablet Take 75 mg by mouth daily.   Yes Historical Provider, MD  doxazosin (CARDURA) 8 MG tablet Take 8 mg by mouth at bedtime.   Yes Historical Provider, MD  doxycycline (VIBRAMYCIN) 100 MG capsule Take 100 mg by mouth 2 (two) times daily. For 7 days. Started on 03-02-13   Yes Historical Provider, MD  glipiZIDE (GLUCOTROL) 10 MG tablet Take 10 mg by mouth 2 (two) times daily before a meal.   Yes Historical Provider, MD  insulin detemir (LEVEMIR) 100 UNIT/ML injection Inject 25 Units into the skin at bedtime.   Yes Historical Provider, MD  leflunomide (ARAVA) 20 MG tablet Take 20 mg by mouth daily.   Yes Historical Provider, MD  metFORMIN (GLUCOPHAGE) 1000 MG tablet Take 1,000 mg by mouth 2 (two) times daily with a meal.   Yes Historical Provider, MD  Multiple Vitamins-Minerals (MULTIVITAMIN WITH MINERALS) tablet Take 1 tablet by mouth daily.   Yes Historical Provider, MD  pioglitazone (ACTOS) 45 MG tablet Take 45 mg  by mouth every other day.   Yes Historical Provider, MD  traMADol (ULTRAM) 50 MG tablet Take 50 mg by mouth every 6 (six) hours as needed for pain (pain).   Yes Historical Provider, MD   Physical Exam: Filed Vitals:   03/08/13 1119  BP: 183/88  Pulse: 101  Temp: 98.5 F (36.9 C)  TempSrc: Oral  Resp: 16  SpO2: 99%     General:  NAD  Eyes: PERRL, EOMI  ENT: moist oropharynx  Neck: supple, no JVD  Cardiovascular: RRR without MRG   Respiratory: CTA biL, good air movement without wheezing, rhonchi or crackled  Abdomen: soft, non  tender to palpation, positive bowel sounds, no guarding, no rebound  Skin: no rashes  Musculoskeletal: no peripheral edema. Right fifth toe missing, cellulitic changes are present surrounding the amputation site, bone is exposed and minimal purulent discharges are seen.  Psychiatric: normal mood and affect  Neurologic: CN 2-12 grossly intact, MS 5/5 in all 4  Labs on Admission:  Basic Metabolic Panel:  Recent Labs Lab 03/08/13 1304  NA 140  K 3.7  CL 101  GLUCOSE 282*  BUN 11  CREATININE 0.90   CBC:  Recent Labs Lab 03/08/13 1240 03/08/13 1304  WBC 8.2  --   NEUTROABS 6.6  --   HGB 10.8* 12.2*  HCT 31.8* 36.0*  MCV 81.5  --   PLT 201  --     Radiological Exams on Admission: Dg Foot Complete Right  03/08/2013  *RADIOLOGY REPORT*  Clinical Data: Fifth toe amputation.  Heel ulcer.  RIGHT FOOT COMPLETE - 3+ VIEW  Comparison: Os calcis 01/20/1939.  Findings: There is partial amputation of the fifth digit at the proximal interphalangeal joint.  Nondisplaced lucency is seen in the lateral head of the fifth proximal phalanx, with adjacent osseous fragments.  Degenerative changes are seen at the first metatarsal phalangeal joint with hallux valgus.  Scattered erosive changes are seen in the heads of the metatarsals.  Soft tissue ulceration overlies the dorsal calcaneus without underlying osseous erosion.  IMPRESSION:  1.  Soft tissue ulceration over the calcaneus without evidence of underlying osteomyelitis. 2.  Postoperative changes of partial amputation of the fifth digit. Lucency involving the head of the fifth proximal phalanx is suspicious for fracture.  Please correlate clinically. 3.  First metatarsal phalangeal joint osteoarthritis. 4.  Erosive changes in the heads of the metatarsals.   Original Report Authenticated By: Leanna Battles, M.D.    Dg Foot Complete Right  03/07/2013  *RADIOLOGY REPORT*  Clinical Data: Wound on right fifth toe.  Rule out osteomyelitis.  RIGHT FOOT  COMPLETE - 3+ VIEW  Comparison: None.  Findings: Dense vascular calcifications.  A bandage over the midfoot.  Probable ulcer about the heel on the lateral view.  No underlying osseous destruction. No radio-opaque foreign body.  There is a moderate amount of soft tissue gas about the fifth phalanx.  Poorly evaluated on the lateral.  No gross osseous destruction identified.  There is osseous irregularity about the distal fifth and fourth metatarsal heads medially.  Similar finding about the medial first metatarsal head with underlying first MTP joint osteoarthritis.  IMPRESSION: Moderate soft tissue gas about the fifth phalanx, without gross osseous destruction.  This could represent underlying soft tissue defect, or even a gas producing infection.  Please note that if there is an ongoing concern of osteomyelitis, pre and post contrast MRI is recommended.  Lucencies about the first, fourth, and fifth distal metatarsals are suspicious  for arthropathy such as gouty arthritis.  Clinically correlate.  Probable ulcer about the heel, without underlying osseous destruction.   Original Report Authenticated By: Jeronimo Greaves, M.D.     EKG: Independently reviewed.  Assessment/Plan Active Problems:   Diabetes   HTN (hypertension)   Osteomyelitis   Right fifth toe infection - With bone exposed an active infection this is osteomyelitis until proven otherwise - Vascular surgery has been consulted (Dr. Arbie Cookey); appreciate their input - He does have cellulitic changes, on vancomycin and Zosyn at this point.  Insulin-dependent diabetes - Check a hemoglobin A1c - On Lantus and sliding scale  Hypertension - Continue home medications  Peripheral vascular disease status post stenting - Continue Plavix - Appreciate vascular input  Hyperlipidemia - Continue simvastatin  DVT prophylaxis - Heparin subcutaneous  Code Status: Full  Family Communication: wife and daughter  Disposition Plan: admit  Time spent:  57  Ivyanna Sibert M. Elvera Lennox, MD [facility] Pager [phone]  If 7PM-7AM, please contact night-coverage [email or web address] Password [facility] 03/08/2013, 3:34 PM

## 2013-03-08 NOTE — Progress Notes (Signed)
[facility] ED CM reviewed EPIC notes for this 03/08/13 ED visit  CM spoke with pt who verified that he was not expecting Dr Jimmey Ralph to remove his toe today during the wound care center visit  Pt confirms with ED CM that he has a rolator and a cane States he is able to ambulate and has cane in his car  Family at bedside and supportive (2 male family members and a male toddler)

## 2013-03-08 NOTE — ED Notes (Signed)
Receiving rn aware that vancomycin has already been hung.  Zosyn has not been hung and will be sent up with pt to the floor.

## 2013-03-08 NOTE — Progress Notes (Signed)
Quick Note:  Labs abnormal. X-rays are normal. Is this patient seeing orthopedics? ______

## 2013-03-08 NOTE — Progress Notes (Signed)
ANTIBIOTIC CONSULT NOTE - INITIAL  Pharmacy Consult for Vancomycin and Zosyn Indication: R 5th toe amputation/ osteomyelitis  No Known Allergies  Patient Measurements:   TBW: 91.8 kg  Vital Signs: Temp: 98.5 F (36.9 C) (04/09 1119) Temp src: Oral (04/09 1119) BP: 183/88 mmHg (04/09 1119) Pulse Rate: 101 (04/09 1119) Intake/Output from previous day:   Intake/Output from this shift:    Labs:  Recent Labs  03/08/13 1240 03/08/13 1304  WBC 8.2  --   HGB 10.8* 12.2*  PLT 201  --   CREATININE  --  0.90   The CrCl is unknown because both a height and weight (above a minimum accepted value) are required for this calculation. No results found for this basename: VANCOTROUGH, VANCOPEAK, VANCORANDOM, GENTTROUGH, GENTPEAK, GENTRANDOM, TOBRATROUGH, TOBRAPEAK, TOBRARND, AMIKACINPEAK, AMIKACINTROU, AMIKACIN,  in the last 72 hours   Microbiology: No results found for this or any previous visit (from the past 720 hour(s)).  Medical History: Past Medical History  Diagnosis Date  . Diabetes mellitus without complication   . Arthritis   . Hypertension    Medications:  Anti-infectives   Start     Dose/Rate Route Frequency Ordered Stop   03/09/13 0200  vancomycin (VANCOCIN) IVPB 1000 mg/200 mL premix     1,000 mg 200 mL/hr over 60 Minutes Intravenous Every 12 hours 03/08/13 1606     03/08/13 1600  piperacillin-tazobactam (ZOSYN) IVPB 3.375 g     3.375 g 12.5 mL/hr over 240 Minutes Intravenous Every 8 hours 03/08/13 1447     03/08/13 1430  vancomycin (VANCOCIN) IVPB 1000 mg/200 mL premix     1,000 mg 200 mL/hr over 60 Minutes Intravenous  Once 03/08/13 1341 03/08/13 1503     Assessment: 71 yoM with [facility]. R 5th toe gangrenous, hx of DM, concern for osteomyelitis. Presented to [facility] for wound care/stitches.   Xray of foot: no evidence of osteomyelitis, MRI suggested  Vancomycin/Zosyn per pharmacy  Goal of Therapy:  Vancomycin trough  level 10-15 mcg/ml  Plan:  Vancomycin 1gm q12 Zosyn 3.375gm q8h-4 hr infusion  Otho Bellows PharmD Pager [phone] 03/08/2013, 4:10 PM.

## 2013-03-08 NOTE — ED Provider Notes (Signed)
History     CSN: [id number]  Arrival date & time 03/08/13  1053   First MD Initiated Contact with Patient 03/08/13 1208      Chief Complaint  Patient presents with  . Foot Ulcer  . Toe Injury    (Consider location/radiation/quality/duration/timing/severity/associated sxs/prior treatment) HPI Craig Woodard is a 72 y.o. male who presents to ED with complaint of infection in right 5th toe. Pt states he has had color change to the 5th toe for about 2 wks. Followed by Dr. Jimmey Ralph with wound care. 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 States on doxycyclin right now. Pt reports about a week ago, toe started going "black." was seen by his PCP yesterday, had x-ray done, there was concern for osteomyelitis. Today went to see Dr. Jimmey Ralph to "cliped" his toe off and sent here. Pt states "I am here to get stitches."  Spoke with Dr. Jimmey Ralph, who stated toe was gangrenous, and was partially amputated, states he clipped what is left off, and sent here for IV antibiotics and orthopedics consultation. Pt states mild pain in the toe area. No fever, chills, malaise.    Past Medical History  Diagnosis Date  . Diabetes mellitus without complication   . Arthritis   . Hypertension     Past Surgical History  Procedure Laterality Date  . Toe amputation    . Replacement total knee    . Back surgery    . Vein surgery    . Rotator cuff repair      No family history on file.  History  Substance Use Topics  . Smoking status: Former Smoker -- 1.50 packs/day for 22 years    Types: Cigarettes    Start date: 08/18/1955    Quit date: 08/17/1977  . Smokeless tobacco: Never Used  . Alcohol Use: No      Review of Systems  Constitutional: Negative for fever and chills.  Respiratory: Negative.   Cardiovascular: Negative.   Musculoskeletal: Positive for arthralgias.  Skin: Positive for wound.    Allergies  Review of patient's allergies indicates no known allergies.  Home Medications    Current Outpatient Rx  Name  Route  Sig  Dispense  Refill  . amLODipine (NORVASC) 10 MG tablet   Oral   Take 10 mg by mouth daily.         Marland Kitchen aspirin 81 MG tablet   Oral   Take 81 mg by mouth daily.         . Atorvastatin Calcium (LIPITOR PO)   Oral   Take 1 tablet by mouth daily. Pt doesn't know the dosage of medication. [facility]         . benazepril (LOTENSIN) 40 MG tablet   Oral   Take 40 mg by mouth daily.         . carvedilol (COREG) 25 MG tablet   Oral   Take 50 mg by mouth 2 (two) times daily with a meal.         . clopidogrel (PLAVIX) 75 MG tablet   Oral   Take 75 mg by mouth daily.         Marland Kitchen doxazosin (CARDURA) 8 MG tablet   Oral   Take 8 mg by mouth at bedtime.         Marland Kitchen doxycycline (VIBRAMYCIN) 100 MG capsule   Oral   Take 100 mg by mouth 2 (two) times daily. For 7  days. Started on 03-02-13         . glipiZIDE (GLUCOTROL) 10 MG tablet   Oral   Take 10 mg by mouth 2 (two) times daily before a meal.         . insulin detemir (LEVEMIR) 100 UNIT/ML injection   Subcutaneous   Inject 25 Units into the skin at bedtime.         Marland Kitchen leflunomide (ARAVA) 20 MG tablet   Oral   Take 20 mg by mouth daily.         . metFORMIN (GLUCOPHAGE) 1000 MG tablet   Oral   Take 1,000 mg by mouth 2 (two) times daily with a meal.         . Multiple Vitamins-Minerals (MULTIVITAMIN WITH MINERALS) tablet   Oral   Take 1 tablet by mouth daily.         . pioglitazone (ACTOS) 45 MG tablet   Oral   Take 45 mg by mouth every other day.         . traMADol (ULTRAM) 50 MG tablet   Oral   Take 50 mg by mouth every 6 (six) hours as needed for pain (pain).           BP 183/88  Pulse 101  Temp(Src) 98.5 F (36.9 C) (Oral)  Resp 16  SpO2 99%  Physical Exam  Nursing note and vitals reviewed. Constitutional: He appears well-developed and well-nourished. No distress.  Eyes: Conjunctivae are normal.  Cardiovascular:  Normal rate, regular rhythm and normal heart sounds.   Pulmonary/Chest: Effort normal and breath sounds normal. No respiratory distress. He has no wheezes. He has no rales.  Musculoskeletal: He exhibits no edema.  There is open wound to the 5th toe, with distal phalanx amputated. Hemostatic. Gangrenous skin changes with purulent drainage from the open area. Tender to palpation over MTP joint. Foot is normal. Ulceration to the right heel.   Neurological: He is alert.  Skin: Skin is warm and dry.    ED Course  Procedures (including critical care time)  Labs Reviewed  CBC WITH DIFFERENTIAL - Abnormal; Notable for the following:    RBC 3.90 (*)    Hemoglobin 10.8 (*)    HCT 31.8 (*)    Neutrophils Relative 81 (*)    All other components within normal limits  POCT I-STAT, CHEM 8 - Abnormal; Notable for the following:    Glucose, Bld 282 (*)    Calcium, Ion 1.08 (*)    Hemoglobin 12.2 (*)    HCT 36.0 (*)    All other components within normal limits  WOUND CULTURE   Dg Foot Complete Right  03/08/2013  *RADIOLOGY REPORT*  Clinical Data: Fifth toe amputation.  Heel ulcer.  RIGHT FOOT COMPLETE - 3+ VIEW  Comparison: Os calcis 01/20/1939.  Findings: There is partial amputation of the fifth digit at the proximal interphalangeal joint.  Nondisplaced lucency is seen in the lateral head of the fifth proximal phalanx, with adjacent osseous fragments.  Degenerative changes are seen at the first metatarsal phalangeal joint with hallux valgus.  Scattered erosive changes are seen in the heads of the metatarsals.  Soft tissue ulceration overlies the dorsal calcaneus without underlying osseous erosion.  IMPRESSION:  1.  Soft tissue ulceration over the calcaneus without evidence of underlying osteomyelitis. 2.  Postoperative changes of partial amputation of the fifth digit. Lucency involving the head of the fifth proximal phalanx is suspicious for fracture.  Please correlate clinically. 3.  First  metatarsal  phalangeal joint osteoarthritis. 4.  Erosive changes in the heads of the metatarsals.   Original Report Authenticated By: Leanna Battles, M.D.    Dg Foot Complete Right  03/07/2013  *RADIOLOGY REPORT*  Clinical Data: Wound on right fifth toe.  Rule out osteomyelitis.  RIGHT FOOT COMPLETE - 3+ VIEW  Comparison: None.  Findings: Dense vascular calcifications.  A bandage over the midfoot.  Probable ulcer about the heel on the lateral view.  No underlying osseous destruction. No radio-opaque foreign body.  There is a moderate amount of soft tissue gas about the fifth phalanx.  Poorly evaluated on the lateral.  No gross osseous destruction identified.  There is osseous irregularity about the distal fifth and fourth metatarsal heads medially.  Similar finding about the medial first metatarsal head with underlying first MTP joint osteoarthritis.  IMPRESSION: Moderate soft tissue gas about the fifth phalanx, without gross osseous destruction.  This could represent underlying soft tissue defect, or even a gas producing infection.  Please note that if there is an ongoing concern of osteomyelitis, pre and post contrast MRI is recommended.  Lucencies about the first, fourth, and fifth distal metatarsals are suspicious for arthropathy such as gouty arthritis.  Clinically correlate.  Probable ulcer about the heel, without underlying osseous destruction.   Original Report Authenticated By: Jeronimo Greaves, M.D.      1. Osteomyelitis of toe of right foot   2. Foot ulcer, right, with necrosis of bone   3. Hyperglycemia       MDM  Pt with worsening right toe infection, partially amputated today at Dr. Lavone Neri office. Area has purulent drainage, appears infected. WIll admit to [facility] with orthopedics consult. Vancomycin ordered for infection.   Spoke with Dr. Arbie Cookey, will consult given pt's recent vascular work up. i ordered ABI and vascular studies per his request and records from his vascular surgeon in [place], Dr. Raquel Sarna.    Filed Vitals:   03/08/13 1119  BP: 183/88  Pulse: 101  Temp: 98.5 F (36.9 C)  TempSrc: Oral  Resp: 16  SpO2: 99%         Myriam Jacobson Abreanna Drawdy, PA-C 03/08/13 2059

## 2013-03-09 DIAGNOSIS — L97409 Non-pressure chronic ulcer of unspecified heel and midfoot with unspecified severity: Secondary | ICD-10-CM

## 2013-03-09 DIAGNOSIS — L03115 Cellulitis of right lower limb: Secondary | ICD-10-CM | POA: Diagnosis present

## 2013-03-09 DIAGNOSIS — L03119 Cellulitis of unspecified part of limb: Secondary | ICD-10-CM

## 2013-03-09 DIAGNOSIS — L97509 Non-pressure chronic ulcer of other part of unspecified foot with unspecified severity: Secondary | ICD-10-CM

## 2013-03-09 DIAGNOSIS — I96 Gangrene, not elsewhere classified: Secondary | ICD-10-CM | POA: Diagnosis present

## 2013-03-09 LAB — GLUCOSE, CAPILLARY
Glucose-Capillary: 181 mg/dL — ABNORMAL HIGH (ref 70–99)
Glucose-Capillary: 292 mg/dL — ABNORMAL HIGH (ref 70–99)
Glucose-Capillary: 209 mg/dL — ABNORMAL HIGH (ref 70–99)

## 2013-03-09 LAB — HEMOGLOBIN A1C: Mean Plasma Glucose: 200 mg/dL — ABNORMAL HIGH (ref <117)

## 2013-03-09 MED ORDER — ATORVASTATIN CALCIUM 20 MG PO TABS
20.0000 mg | ORAL_TABLET | Freq: Every day | ORAL | Status: DC
  Administered 2013-03-09 – 2013-03-13 (×4): 20 mg via ORAL
  Filled 2013-03-09 (×6): qty 1

## 2013-03-09 MED ORDER — SODIUM CHLORIDE 0.9 % IV SOLN
INTRAVENOUS | Status: DC
  Administered 2013-03-10 – 2013-03-11 (×3): via INTRAVENOUS

## 2013-03-09 NOTE — Progress Notes (Signed)
Nutrition Brief Note  Patient identified on the Malnutrition Screening Tool (MST) Report  Body mass index is 27.47 kg/(m^2). Patient meets criteria for overweight based on current BMI.   Current diet order is CHO modified, patient is consuming approximately 100% of meals at this time. Labs and medications reviewed. Noted pt with elevated HbA1c seen by diabetic coordinator today. Met with pt who reports eating well PTA. Pt reports 7-8 pound intentional weight loss in the past 2 months from drinking more water, eating more vegetables, and overall eating healthier. Pt denied any nutrition educational needs.   No nutrition interventions warranted at this time. If nutrition issues arise, please consult RD.   Levon Hedger MS, RD, LDN [phone] Pager [phone] After Hours Pager

## 2013-03-09 NOTE — Progress Notes (Addendum)
[facility] PROGRESS NOTE  Craig Woodard ZOX:[id number] DOB: [birth year] DOA: 03/08/2013 PCP: Redmond Baseman, MD  Assessment/Plan: 1. R toe Gangrene with surrounding mild cellulitis changes, partial amputation by Dr.PArker at wound center 4/9 -no clear osteo per Xray - Continue IV Vanc/Zosyn  - was seen by [facility] Dr.Early yesteday - h/o PCI and stenting of R leg vasculature by Dr.Payvar at [facility], will request records, continue Plavix - I have requested Ortho consult per Dr.Duda today  2. DM:  -continue Levemir , SSI -Hbaic 8.6  3. H/o RA on Leflunomide  4. HTN: continue benazepril and amlodipine  5. PVD s/p PCI -continue plavix -requested records from [place]  Code Status: FUll  Family Communication: none at bedside Disposition Plan: home when improved   Consultants:  [facility] Dr.Early  Dr.Duda, ORthopedics  Antibiotics:  Vanc 4/9  Zosyn 4/9  HPI/Subjective: Pain at the site of the fifth toe, improved with pain meds  Objective: Filed Vitals:   03/08/13 1537 03/08/13 2208 03/09/13 0539 03/09/13 0800  BP: 171/78 119/67 121/73 124/72  Pulse: 85 75 72 76  Temp: 98.6 F (37 C) 99.7 F (37.6 C) 98.1 F (36.7 C)   TempSrc: Oral Oral Oral   Resp: 16 16 17    Height: 6' (1.829 m)     Weight: 91.899 kg (202 lb 9.6 oz)     SpO2: 98% 96% 97%     Intake/Output Summary (Last 24 hours) at 03/09/13 1009 Last data filed at 03/09/13 0836  Gross per 24 hour  Intake    840 ml  Output   1010 ml  Net   -170 ml   Filed Weights   03/08/13 1537  Weight: 91.899 kg (202 lb 9.6 oz)    Exam: General: NAD  Eyes: PERRL, EOMI  ENT: moist oropharynx  Neck: supple, no JVD  Cardiovascular: RRR without MRG  Respiratory: CTA biL, good air movement without wheezing, rhonchi or crackled  Abdomen: soft, non tender to palpation, positive bowel sounds, no guarding, no rebound  Skin: no rashes  Musculoskeletal: no peripheral edema. Dry gangrene of proximal Right fifth toe  with majority of toe missing, bone exposed, cellulitic changes are present surrounding the amputation site Large 2x2 cm heel ulcer, 1cm deep with some purulence Psychiatric: normal mood and affect  Neurologic: CN 2-12 grossly intact, MS 5/5 in all 4    Data Reviewed: Basic Metabolic Panel:  Recent Labs Lab 03/08/13 1304  NA 140  K 3.7  CL 101  GLUCOSE 282*  BUN 11  CREATININE 0.90   Liver Function Tests: No results found for this basename: AST, ALT, ALKPHOS, BILITOT, PROT, ALBUMIN,  in the last 168 hours No results found for this basename: LIPASE, AMYLASE,  in the last 168 hours No results found for this basename: AMMONIA,  in the last 168 hours CBC:  Recent Labs Lab 03/08/13 1240 03/08/13 1304  WBC 8.2  --   NEUTROABS 6.6  --   HGB 10.8* 12.2*  HCT 31.8* 36.0*  MCV 81.5  --   PLT 201  --    Cardiac Enzymes: No results found for this basename: CKTOTAL, CKMB, CKMBINDEX, TROPONINI,  in the last 168 hours BNP (last 3 results) No results found for this basename: PROBNP,  in the last 8760 hours CBG:  Recent Labs Lab 03/08/13 1613 03/08/13 2200 03/09/13 0738  GLUCAP 203* 118* 156*    Recent Results (from the past 240 hour(s))  WOUND CULTURE     Status: None  Collection Time    04-01-13  1:28 PM      Result Value Range Status   Specimen Description TOE RIGHT LITTLE TOE   Final   Special Requests NONE   Final   Gram Stain     Final   Value: NO WBC SEEN     NO SQUAMOUS EPITHELIAL CELLS SEEN     RARE GRAM POSITIVE COCCI     IN PAIRS   Culture Culture reincubated for better growth   Final   Report Status PENDING   Incomplete     Studies: Dg Foot Complete Right  04-01-13  *RADIOLOGY REPORT*  Clinical Data: Fifth toe amputation.  Heel ulcer.  RIGHT FOOT COMPLETE - 3+ VIEW  Comparison: Os calcis 01/20/1939.  Findings: There is partial amputation of the fifth digit at the proximal interphalangeal joint.  Nondisplaced lucency is seen in the lateral head of the  fifth proximal phalanx, with adjacent osseous fragments.  Degenerative changes are seen at the first metatarsal phalangeal joint with hallux valgus.  Scattered erosive changes are seen in the heads of the metatarsals.  Soft tissue ulceration overlies the dorsal calcaneus without underlying osseous erosion.  IMPRESSION:  1.  Soft tissue ulceration over the calcaneus without evidence of underlying osteomyelitis. 2.  Postoperative changes of partial amputation of the fifth digit. Lucency involving the head of the fifth proximal phalanx is suspicious for fracture.  Please correlate clinically. 3.  First metatarsal phalangeal joint osteoarthritis. 4.  Erosive changes in the heads of the metatarsals.   Original Report Authenticated By: Leanna Battles, M.D.    Dg Foot Complete Right  03/07/2013  *RADIOLOGY REPORT*  Clinical Data: Wound on right fifth toe.  Rule out osteomyelitis.  RIGHT FOOT COMPLETE - 3+ VIEW  Comparison: None.  Findings: Dense vascular calcifications.  A bandage over the midfoot.  Probable ulcer about the heel on the lateral view.  No underlying osseous destruction. No radio-opaque foreign body.  There is a moderate amount of soft tissue gas about the fifth phalanx.  Poorly evaluated on the lateral.  No gross osseous destruction identified.  There is osseous irregularity about the distal fifth and fourth metatarsal heads medially.  Similar finding about the medial first metatarsal head with underlying first MTP joint osteoarthritis.  IMPRESSION: Moderate soft tissue gas about the fifth phalanx, without gross osseous destruction.  This could represent underlying soft tissue defect, or even a gas producing infection.  Please note that if there is an ongoing concern of osteomyelitis, pre and post contrast MRI is recommended.  Lucencies about the first, fourth, and fifth distal metatarsals are suspicious for arthropathy such as gouty arthritis.  Clinically correlate.  Probable ulcer about the heel, without  underlying osseous destruction.   Original Report Authenticated By: Jeronimo Greaves, M.D.     Scheduled Meds: . amLODipine  10 mg Oral Daily  . aspirin  81 mg Oral Daily  . benazepril  40 mg Oral Daily  . carvedilol  50 mg Oral BID WC  . clopidogrel  75 mg Oral Daily  . doxazosin  8 mg Oral QHS  . heparin  5,000 Units Subcutaneous Q8H  . insulin aspart  0-15 Units Subcutaneous TID WC  . insulin detemir  25 Units Subcutaneous QHS  . leflunomide  20 mg Oral Daily  . piperacillin-tazobactam (ZOSYN)  IV  3.375 g Intravenous Q8H  . simvastatin  40 mg Oral QPM  . vancomycin  1,000 mg Intravenous Q12H   Continuous Infusions:  Active Problems:   Diabetes   HTN (hypertension)   Osteomyelitis    Time spent:    [facility]  [facility] Pager [phone]. If 7PM-7AM, please contact night-coverage at [email or web address], password [facility] 03/09/2013, 10:09 AM  LOS: 1 day

## 2013-03-09 NOTE — Progress Notes (Signed)
VASCULAR LAB PRELIMINARY  ARTERIAL  ABI completed:    RIGHT    LEFT    PRESSURE WAVEFORM  PRESSURE WAVEFORM  BRACHIAL 136 Triphasic BRACHIAL 144 Triphasic  DP 238 Triphasic DP 152 Triphasic  AT   AT    PT 46 Monophasic PT 79 Dampened Monophasic  PER   PER    GREAT TOE  NA GREAT TOE  NA    RIGHT LEFT  ABI 1.65 1.06    The right ABI is elevated, suggestive of calcified vessels. The left ABI is within normal limits. Arterial perfusion is adequate bilaterally.  03/09/2013 11:33 AM Gertie Fey, RDMS, RDCS

## 2013-03-09 NOTE — Progress Notes (Signed)
Inpatient Diabetes Program Recommendations  AACE/ADA: New Consensus Statement on Inpatient Glycemic Control (2013)  Target Ranges:  Prepandial:   less than 140 mg/dL      Peak postprandial:   less than 180 mg/dL (1-2 hours)      Critically ill patients:  140 - 180 mg/dL   Reason for Visit: Elevated HgbA1C  72 y.o. male has a past medical history significant for insulin-dependent diabetes, severe peripheral vascular disease status post 2 stents, presented to the emergency room after being seen  in the wound care clinic (Dr. Jimmey Ralph) where he had his right fifth toe amputated.  Hx IDDM and sees Dr. Modesto Charon in [place] for diabetes management. Checks blood sugars every morning and tries to watch diet.  Uses insulin pen for Levemir 25 units QHS, Actos 45 mg every other day, glipizide 10 mg bid and metformin 1000 mg bid. States HgbA1C usuallly runs near 7, but blood sugars have been elevated lately with foot infection.  Has had previous diabetes education.  Results for Craig Woodard, Craig Woodard (MRN [id number]) as of 03/09/2013 13:12  Ref. Range 03/08/2013 14:54  Hemoglobin A1C Latest Range: <5.7 % 8.6 (H)   Results for Craig Woodard, Craig Woodard (MRN [id number]) as of 03/09/2013 13:12  Ref. Range 03/08/2013 16:13 03/08/2013 22:00 03/09/2013 07:38 03/09/2013 12:01  Glucose-Capillary Latest Range: 70-99 mg/dL 324 (H) 401 (H) 027 (H) 181 (H)     Inpatient Diabetes Program Recommendations Correction (SSI): Add HS correction coverage  Note: Will continue to follow.  Thank you. Ailene Ards, RD, LDN, CDE Inpatient Diabetes Coordinator [phone]

## 2013-03-09 NOTE — Progress Notes (Signed)
Forms needed to request medical records as specified by Dr. Jomarie Longs from [facility] faxed by secretary. Awaiting response.

## 2013-03-09 NOTE — Care Management Note (Addendum)
    Page 1 of 1   03/13/2013     4:03:43 PM   CARE MANAGEMENT NOTE 03/13/2013  Patient:  Craig Woodard, Craig Woodard   Account Number:  [id number]  Date Initiated:  03/09/2013  Documentation initiated by:  Lorenda Ishihara  Subjective/Objective Assessment:   72 yo male admitted possible osteo, right toe amputation. PTA lived home with spouse.     Action/Plan:   Home when stable   Anticipated DC Date:  03/14/2013   Anticipated DC Plan:  SKILLED NURSING FACILITY  In-house referral  Clinical Social Worker      DC Planning Services  CM consult      Choice offered to / List presented to:             Status of service:  Completed, signed off Medicare Important Message given?   (If response is "NO", the following Medicare IM given date fields will be blank) Date Medicare IM given:   Date Additional Medicare IM given:    Discharge Disposition:  SKILLED NURSING FACILITY  Per UR Regulation:  Reviewed for med. necessity/level of care/duration of stay  If discussed at Long Length of Stay Meetings, dates discussed:    Comments:

## 2013-03-09 NOTE — Consult Note (Signed)
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 Ricki Miller is an 72 y.o. male.  HPI:  Patient is a 72 year old gentleman who has undergone prolonged conservative therapy for the right foot he has been treated by vascular surgeons in [place] he has been treated at the wound center and presents at this time with gangrene of the right little toe and a chronic right heel decubitus ulcer.  Past Medical History  Diagnosis Date  . Diabetes mellitus without complication   . Arthritis   . Hypertension     Past Surgical History  Procedure Laterality Date  . Toe amputation  03/08/2013    Right pinkie toe  . Replacement total knee    . Back surgery    . Vein surgery      right leg  . Rotator cuff repair      History reviewed. No pertinent family history.  Social History:  reports that he quit smoking about 35 years ago. His smoking use included Cigarettes. He started smoking about 57 years ago. He has a 33 pack-year smoking history. He has never used smokeless tobacco. He reports that he does not drink alcohol or use illicit drugs.  Allergies: No Known Allergies  Medications: I have reviewed the patient's current medications.  Results for orders placed during the hospital encounter of 03/08/13 (from the past 48 hour(s))  CBC WITH DIFFERENTIAL     Status: Abnormal   Collection Time    03/08/13 12:40 PM      Result Value Range   WBC 8.2  4.0 - 10.5 K/uL   RBC 3.90 (*) 4.22 - 5.81 MIL/uL   Hemoglobin 10.8 (*) 13.0 - 17.0 g/dL   HCT 16.1 (*) 09.6 - 04.5 %   MCV 81.5  78.0 - 100.0 fL   MCH 27.7  26.0 - 34.0 pg   MCHC 34.0  30.0 - 36.0 g/dL   RDW 40.9  81.1 - 91.4 %   Platelets 201  150 - 400 K/uL   Neutrophils Relative 81 (*) 43 - 77 %   Neutro Abs 6.6  1.7 - 7.7 K/uL   Lymphocytes Relative 13  12 - 46 %   Lymphs Abs 1.0  0.7 - 4.0 K/uL   Monocytes Relative 6  3 - 12 %   Monocytes Absolute 0.5  0.1 - 1.0  K/uL   Eosinophils Relative 1  0 - 5 %   Eosinophils Absolute 0.1  0.0 - 0.7 K/uL   Basophils Relative 0  0 - 1 %   Basophils Absolute 0.0  0.0 - 0.1 K/uL  POCT I-STAT, CHEM 8     Status: Abnormal   Collection Time    03/08/13  1:04 PM      Result Value Range   Sodium 140  135 - 145 mEq/L   Potassium 3.7  3.5 - 5.1 mEq/L   Chloride 101  96 - 112 mEq/L   BUN 11  6 - 23 mg/dL   Creatinine, Ser 7.82  0.50 - 1.35 mg/dL   Glucose, Bld 956 (*) 70 - 99 mg/dL   Calcium, Ion 2.13 (*) 1.13 - 1.30 mmol/L   TCO2 28  0 - 100 mmol/L   Hemoglobin 12.2 (*) 13.0 - 17.0 g/dL   HCT 08.6 (*) 57.8 - 46.9 %  WOUND CULTURE     Status: None   Collection Time    03/08/13  1:28 PM  Result Value Range   Specimen Description TOE RIGHT LITTLE TOE     Special Requests NONE     Gram Stain       Value: NO WBC SEEN     NO SQUAMOUS EPITHELIAL CELLS SEEN     RARE GRAM POSITIVE COCCI     IN PAIRS   Culture Culture reincubated for better growth     Report Status PENDING    HEMOGLOBIN A1C     Status: Abnormal   Collection Time    03/08/13  2:54 PM      Result Value Range   Hemoglobin A1C 8.6 (*) <5.7 %   Comment: (NOTE)                                                                               According to the ADA Clinical Practice Recommendations for 2011, when     HbA1c is used as a screening test:      >=6.5%   Diagnostic of Diabetes Mellitus               (if abnormal result is confirmed)     5.7-6.4%   Increased risk of developing Diabetes Mellitus     References:Diagnosis and Classification of Diabetes Mellitus,Diabetes     Care,2011,34(Suppl 1):S62-S69 and Standards of Medical Care in             Diabetes - 2011,Diabetes Care,2011,34 (Suppl 1):S11-S61.   Mean Plasma Glucose 200 (*) <117 mg/dL  GLUCOSE, CAPILLARY     Status: Abnormal   Collection Time    03/08/13  4:13 PM      Result Value Range   Glucose-Capillary 203 (*) 70 - 99 mg/dL  GLUCOSE, CAPILLARY     Status: Abnormal    Collection Time    03/08/13 10:00 PM      Result Value Range   Glucose-Capillary 118 (*) 70 - 99 mg/dL  GLUCOSE, CAPILLARY     Status: Abnormal   Collection Time    03/09/13  7:38 AM      Result Value Range   Glucose-Capillary 156 (*) 70 - 99 mg/dL   Comment 1 Notify RN     Comment 2 Documented in Chart    GLUCOSE, CAPILLARY     Status: Abnormal   Collection Time    03/09/13 12:01 PM      Result Value Range   Glucose-Capillary 181 (*) 70 - 99 mg/dL   Comment 1 Notify RN     Comment 2 Documented in Chart    GLUCOSE, CAPILLARY     Status: Abnormal   Collection Time    03/09/13  4:18 PM      Result Value Range   Glucose-Capillary 292 (*) 70 - 99 mg/dL   Comment 1 Notify RN     Comment 2 Documented in Chart      Dg Foot Complete Right  03/08/2013  *RADIOLOGY REPORT*  Clinical Data: Fifth toe amputation.  Heel ulcer.  RIGHT FOOT COMPLETE - 3+ VIEW  Comparison: Os calcis 01/20/1939.  Findings: There is partial amputation of the fifth digit at the proximal interphalangeal joint.  Nondisplaced lucency is seen in the lateral head of the  fifth proximal phalanx, with adjacent osseous fragments.  Degenerative changes are seen at the first metatarsal phalangeal joint with hallux valgus.  Scattered erosive changes are seen in the heads of the metatarsals.  Soft tissue ulceration overlies the dorsal calcaneus without underlying osseous erosion.  IMPRESSION:  1.  Soft tissue ulceration over the calcaneus without evidence of underlying osteomyelitis. 2.  Postoperative changes of partial amputation of the fifth digit. Lucency involving the head of the fifth proximal phalanx is suspicious for fracture.  Please correlate clinically. 3.  First metatarsal phalangeal joint osteoarthritis. 4.  Erosive changes in the heads of the metatarsals.   Original Report Authenticated By: Leanna Battles, M.D.     Review of Systems  All other systems reviewed and are negative.   Blood pressure 116/72, pulse 77,  temperature 98.9 F (37.2 C), temperature source Oral, resp. rate 17, height 6' (1.829 m), weight 91.899 kg (202 lb 9.6 oz), SpO2 96.00%. Physical Exam On examination patient has a faintly palpable dorsalis and posterior tibial pulse. By report patient did have Doppler studies which showed good circulation he has also been seen by Dr. early. Patient is not felt to require any additional revascularization. Examination he has black gangrene of the right little toe with exposed bone. He has a chronic right heel decubitus ulcer which is 3 cm in diameter. Assessment/Plan: Assessment: Gangrene right little toe with chronic right heel decubitus ulcer.  Plan: Will plan for a right foot fifth ray amputation and application of allograft split thickness skin graft to the right heel. Risks and benefits were discussed including infection neurovascular injury nonhealing of the wounds need for additional surgery. Patient states he understands and wished to proceed at this time.  Luvena Wentling V 03/09/2013, 6:48 PM

## 2013-03-09 NOTE — ED Provider Notes (Signed)
  Medical screening examination/treatment/procedure(s) were performed by non-physician practitioner and as supervising physician I was immediately available for consultation/collaboration.  On my exam the patient was in no distress.  Is clear that although the patient feels he was sent here for placement of sutures, there is concern for focal infection of the area about the amputated toe.  I saw the ECG (if appropriate), relevant labs and studies - I agree with the interpretation.    Gerhard Munch, MD 03/09/13 1549

## 2013-03-10 ENCOUNTER — Inpatient Hospital Stay (HOSPITAL_COMMUNITY): Payer: Medicare Other | Admitting: Anesthesiology

## 2013-03-10 ENCOUNTER — Encounter (HOSPITAL_COMMUNITY)
Admission: EM | Disposition: A | Discharge status: Skilled Nursing Facility | Payer: Self-pay | Source: Home / Self Care | Attending: Internal Medicine

## 2013-03-10 ENCOUNTER — Encounter (HOSPITAL_COMMUNITY): Payer: Self-pay | Admitting: Anesthesiology

## 2013-03-10 HISTORY — PX: AMPUTATION: SHX166

## 2013-03-10 LAB — WOUND CULTURE

## 2013-03-10 LAB — GLUCOSE, CAPILLARY
Glucose-Capillary: 116 mg/dL — ABNORMAL HIGH (ref 70–99)
Glucose-Capillary: 110 mg/dL — ABNORMAL HIGH (ref 70–99)

## 2013-03-10 LAB — CBC
MCH: 26.3 pg (ref 26.0–34.0)
MCHC: 32.5 g/dL (ref 30.0–36.0)
Platelets: 191 10*3/uL (ref 150–400)

## 2013-03-10 LAB — BASIC METABOLIC PANEL
Calcium: 9.1 mg/dL (ref 8.4–10.5)
GFR calc non Af Amer: 83 mL/min — ABNORMAL LOW (ref >90)
Sodium: 137 mEq/L (ref 135–145)

## 2013-03-10 SURGERY — AMPUTATION, FOOT, PARTIAL
Anesthesia: Monitor Anesthesia Care | Site: Foot | Laterality: Right

## 2013-03-10 MED ORDER — SODIUM CHLORIDE 0.9 % IV SOLN
INTRAVENOUS | Status: DC
  Administered 2013-03-12: 19:00:00 via INTRAVENOUS

## 2013-03-10 MED ORDER — WARFARIN - PHARMACIST DOSING INPATIENT: Freq: Every day | Status: DC

## 2013-03-10 MED ORDER — METOCLOPRAMIDE HCL 5 MG/ML IJ SOLN: 5.0000 mg | Freq: Three times a day (TID) | INTRAMUSCULAR | Status: DC | PRN

## 2013-03-10 MED ORDER — FENTANYL CITRATE 0.05 MG/ML IJ SOLN
INTRAMUSCULAR | Status: DC | PRN
  Administered 2013-03-10 (×4): 50 ug via INTRAVENOUS

## 2013-03-10 MED ORDER — MEPERIDINE HCL 50 MG/ML IJ SOLN: 6.2500 mg | INTRAMUSCULAR | Status: DC | PRN

## 2013-03-10 MED ORDER — OXYCODONE-ACETAMINOPHEN 5-325 MG PO TABS
1.0000 | ORAL_TABLET | ORAL | Status: DC | PRN
  Administered 2013-03-11 – 2013-03-14 (×9): 2 via ORAL
  Filled 2013-03-10 (×10): qty 2

## 2013-03-10 MED ORDER — OXYCODONE HCL 5 MG PO TABS: 5.0000 mg | ORAL_TABLET | Freq: Once | ORAL | Status: DC | PRN

## 2013-03-10 MED ORDER — HYDROCODONE-ACETAMINOPHEN 5-325 MG PO TABS
1.0000 | ORAL_TABLET | ORAL | Status: DC | PRN
  Administered 2013-03-11 – 2013-03-13 (×2): 2 via ORAL
  Administered 2013-03-14: 1 via ORAL
  Filled 2013-03-10 (×2): qty 2

## 2013-03-10 MED ORDER — PROMETHAZINE HCL 25 MG/ML IJ SOLN: 6.2500 mg | INTRAMUSCULAR | Status: DC | PRN

## 2013-03-10 MED ORDER — OXYCODONE HCL 5 MG/5ML PO SOLN
5.0000 mg | Freq: Once | ORAL | Status: DC | PRN
  Filled 2013-03-10: qty 5

## 2013-03-10 MED ORDER — ONDANSETRON HCL 4 MG PO TABS: 4.0000 mg | ORAL_TABLET | Freq: Four times a day (QID) | ORAL | Status: DC | PRN

## 2013-03-10 MED ORDER — MORPHINE SULFATE 2 MG/ML IJ SOLN
1.0000 mg | INTRAMUSCULAR | Status: DC | PRN
  Administered 2013-03-10 (×3): 1 mg via INTRAVENOUS
  Filled 2013-03-10 (×3): qty 1

## 2013-03-10 MED ORDER — HYDROMORPHONE HCL PF 1 MG/ML IJ SOLN
0.5000 mg | INTRAMUSCULAR | Status: DC | PRN
  Administered 2013-03-11 – 2013-03-12 (×4): 1 mg via INTRAVENOUS
  Filled 2013-03-10 (×4): qty 1

## 2013-03-10 MED ORDER — METOCLOPRAMIDE HCL 10 MG PO TABS: 5.0000 mg | ORAL_TABLET | Freq: Three times a day (TID) | ORAL | Status: DC | PRN

## 2013-03-10 MED ORDER — HYDROMORPHONE HCL PF 1 MG/ML IJ SOLN: 0.2500 mg | INTRAMUSCULAR | Status: DC | PRN

## 2013-03-10 MED ORDER — ONDANSETRON HCL 4 MG/2ML IJ SOLN: 4.0000 mg | Freq: Four times a day (QID) | INTRAMUSCULAR | Status: DC | PRN

## 2013-03-10 MED ORDER — MIDAZOLAM HCL 5 MG/5ML IJ SOLN
INTRAMUSCULAR | Status: DC | PRN
  Administered 2013-03-10 (×2): 1 mg via INTRAVENOUS

## 2013-03-10 MED ORDER — POLYETHYLENE GLYCOL 3350 17 G PO PACK
17.0000 g | PACK | Freq: Every day | ORAL | Status: DC | PRN
  Filled 2013-03-10: qty 1

## 2013-03-10 MED ORDER — ROPIVACAINE HCL 5 MG/ML IJ SOLN
INTRAMUSCULAR | Status: DC | PRN
  Administered 2013-03-10: 30 mL

## 2013-03-10 MED ORDER — KETAMINE HCL 10 MG/ML IJ SOLN
INTRAMUSCULAR | Status: DC | PRN
  Administered 2013-03-10 (×2): 10 mg via INTRAVENOUS

## 2013-03-10 MED ORDER — ACETAMINOPHEN 10 MG/ML IV SOLN: 1000.0000 mg | Freq: Once | INTRAVENOUS | Status: DC | PRN

## 2013-03-10 MED ORDER — LACTATED RINGERS IV SOLN
INTRAVENOUS | Status: DC | PRN
  Administered 2013-03-10: 18:00:00 via INTRAVENOUS

## 2013-03-10 MED ORDER — LACTATED RINGERS IV SOLN: INTRAVENOUS | Status: DC

## 2013-03-10 MED ORDER — PROPOFOL 10 MG/ML IV EMUL
INTRAVENOUS | Status: DC | PRN
  Administered 2013-03-10: 100 ug/kg/min via INTRAVENOUS

## 2013-03-10 MED ORDER — WARFARIN SODIUM 7.5 MG PO TABS
7.5000 mg | ORAL_TABLET | Freq: Once | ORAL | Status: AC
  Administered 2013-03-10: 7.5 mg via ORAL
  Filled 2013-03-10: qty 1

## 2013-03-10 SURGICAL SUPPLY — 32 items
BAG SPEC THK2 15X12 ZIP CLS (MISCELLANEOUS) ×1
BAG ZIPLOCK 12X15 (MISCELLANEOUS) ×2 IMPLANT
BANDAGE GAUZE ELAST BULKY 4 IN (GAUZE/BANDAGES/DRESSINGS) ×2 IMPLANT
BLADE SURG SZ10 CARB STEEL (BLADE) ×2 IMPLANT
BNDG COHESIVE 4X5 TAN STRL (GAUZE/BANDAGES/DRESSINGS) ×2 IMPLANT
BNDG COHESIVE 6X5 TAN STRL LF (GAUZE/BANDAGES/DRESSINGS) ×2 IMPLANT
CLOTH BEACON ORANGE TIMEOUT ST (SAFETY) ×2 IMPLANT
DRAPE U-SHAPE 47X51 STRL (DRAPES) ×2 IMPLANT
DRSG ADAPTIC 3X8 NADH LF (GAUZE/BANDAGES/DRESSINGS) ×2 IMPLANT
DRSG EMULSION OIL 3X16 NADH (GAUZE/BANDAGES/DRESSINGS) ×2 IMPLANT
DRSG MEPITEL 4X7.2 (GAUZE/BANDAGES/DRESSINGS) ×2 IMPLANT
DRSG PAD ABDOMINAL 8X10 ST (GAUZE/BANDAGES/DRESSINGS) ×2 IMPLANT
DURAPREP 26ML APPLICATOR (WOUND CARE) ×2 IMPLANT
ELECT REM PT RETURN 9FT ADLT (ELECTROSURGICAL) ×2
ELECTRODE REM PT RTRN 9FT ADLT (ELECTROSURGICAL) ×1 IMPLANT
GLOVE BIOGEL PI IND STRL 8.5 (GLOVE) ×1 IMPLANT
GLOVE BIOGEL PI INDICATOR 8.5 (GLOVE) ×1
GLOVE ECLIPSE 9.0 STRL (GLOVE) ×2 IMPLANT
GLOVE SURG ORTHO 9.0 STRL STRW (GLOVE) ×2 IMPLANT
KIT BASIN OR (CUSTOM PROCEDURE TRAY) ×2 IMPLANT
MANIFOLD NEPTUNE II (INSTRUMENTS) ×2 IMPLANT
NS IRRIG 1000ML POUR BTL (IV SOLUTION) ×2 IMPLANT
PACK LOWER EXTREMITY WL (CUSTOM PROCEDURE TRAY) ×2 IMPLANT
PAD ABD 7.5X8 STRL (GAUZE/BANDAGES/DRESSINGS) ×2 IMPLANT
POSITIONER SURGICAL ARM (MISCELLANEOUS) ×2 IMPLANT
SPONGE GAUZE 4X4 12PLY (GAUZE/BANDAGES/DRESSINGS) ×2 IMPLANT
SUCTION FRAZIER 12FR DISP (SUCTIONS) ×2 IMPLANT
SUT ETHILON 2 0 PSLX (SUTURE) ×6 IMPLANT
SWAB COLLECTION DEVICE MRSA (MISCELLANEOUS) ×2 IMPLANT
TISSUE THERASKIN 2X3 (Tissue) ×2 IMPLANT
TUBE ANAEROBIC SPECIMEN COL (MISCELLANEOUS) ×2 IMPLANT
WATER STERILE IRR 1500ML POUR (IV SOLUTION) ×2 IMPLANT

## 2013-03-10 NOTE — Progress Notes (Signed)
[facility] PROGRESS NOTE  Craig Woodard [id number]:[id number] DOB: May 19, 1941 DOA: 03/08/2013 PCP: Redmond Baseman, MD  Assessment/Plan: 1. R toe Gangrene with surrounding mild cellulitis changes, partial amputation by Dr.PArker at wound center 4/9 -no clear osteo per Xray - Continue IV Vanc/Zosyn  - was seen by [facility] Dr.Early 4/9 - h/o PCI and stenting of R leg vasculature by Dr[facility]Payvar at [facility], will request records, continue Plavix - Greatly appreciate Ortho consult per DR.Lajoyce Corners, plan for toe amputation today   2. DM:  -continue Levemir , SSI -Hbaic 8.6  3. H/o RA on Leflunomide  4. HTN: continue benazepril and amlodipine  5. PVD s/p PCI -continue plavix -requested records from [place]  6. Constipation -miralax PRN  Code Status: FUll  Family Communication: none at bedside Disposition Plan: home when improved   Consultants:  [facility] Dr.Early  Dr.Duda, ORthopedics  Antibiotics:  Vanc 4/9  Zosyn 4/9  HPI/Subjective: Awaiting surgery, no BM yet, wants to wait till afetr surgery for laxatives  Objective: Filed Vitals:   03/09/13 0957 03/09/13 1400 03/09/13 2133 03/10/13 0509  BP: 130/61 116/72 125/65 125/72  Pulse: 71 77 74 77  Temp: 98.9 F (37.2 C) 98.9 F (37.2 C) 99.6 F (37.6 C) 98.1 F (36.7 C)  TempSrc: Oral Oral Oral Oral  Resp: 18 17 16 18   Height:      Weight:      SpO2: 98% 96% 94% 90%    Intake/Output Summary (Last 24 hours) at 03/10/13 1057 Last data filed at 03/10/13 1000  Gross per 24 hour  Intake 2292.5 ml  Output   1400 ml  Net  892.5 ml   Filed Weights   03/08/13 1537  Weight: 91.899 kg (202 lb 9.6 oz)    Exam: General: NAD  Eyes: PERRL, EOMI  ENT: moist oropharynx  Neck: supple, no JVD  Cardiovascular: RRR without MRG  Respiratory: CTA biL, good air movement without wheezing, rhonchi or crackled  Abdomen: soft, non tender to palpation, positive bowel sounds, no guarding, no rebound  Skin: no rashes  Musculoskeletal:  no peripheral edema. Dry gangrene of proximal Right fifth toe with majority of toe missing, bone exposed, cellulitic changes are present surrounding the amputation site Large 2x2 cm heel ulcer, 1cm deep with some purulence Psychiatric: normal mood and affect  Neurologic: CN 2-12 grossly intact, MS 5/5 in all 4    Data Reviewed: Basic Metabolic Panel:  Recent Labs Lab 03/08/13 1304 03/10/13 0415  NA 140 137  K 3.7 3.4*  CL 101 99  CO2  --  28  GLUCOSE 282* 203*  BUN 11 13  CREATININE 0.90 0.95  CALCIUM  --  9.1   Liver Function Tests: No results found for this basename: AST, ALT, ALKPHOS, BILITOT, PROT, ALBUMIN,  in the last 168 hours No results found for this basename: LIPASE, AMYLASE,  in the last 168 hours No results found for this basename: AMMONIA,  in the last 168 hours CBC:  Recent Labs Lab 03/08/13 1240 03/08/13 1304 03/10/13 0415  WBC 8.2  --  8.5  NEUTROABS 6.6  --   --   HGB 10.8* 12.2* 9.7*  HCT 31.8* 36.0* 29.5*  MCV 81.5  --  80.8  PLT 201  --  191   Cardiac Enzymes: No results found for this basename: CKTOTAL, CKMB, CKMBINDEX, TROPONINI,  in the last 168 hours BNP (last 3 results) No results found for this basename: PROBNP,  in the last 8760 hours CBG:  Recent Labs Lab  03/09/13 0738 03/09/13 1201 03/09/13 1618 03/09/13 2132 03/10/13 0714  GLUCAP 156* 181* 292* 209* 165*    Recent Results (from the past 240 hour(s))  WOUND CULTURE     Status: None   Collection Time    03/28/13  1:28 PM      Result Value Range Status   Specimen Description TOE RIGHT LITTLE TOE   Final   Special Requests NONE   Final   Gram Stain     Final   Value: NO WBC SEEN     NO SQUAMOUS EPITHELIAL CELLS SEEN     RARE GRAM POSITIVE COCCI     IN PAIRS   Culture     Final   Value: MULTIPLE ORGANISMS PRESENT, NONE PREDOMINANT     Note: NO STAPHYLOCOCCUS AUREUS ISOLATED NO GROUP A STREP (S.PYOGENES) ISOLATED   Report Status 03/10/2013 FINAL   Final  SURGICAL PCR  SCREEN     Status: None   Collection Time    03/10/13  6:27 AM      Result Value Range Status   MRSA, PCR NEGATIVE  NEGATIVE Final   Staphylococcus aureus NEGATIVE  NEGATIVE Final   Comment:            The Xpert SA Assay (FDA     approved for NASAL specimens     in patients over 101 years of age),     is one component of     a comprehensive surveillance     program.  Test performance has     been validated by [employer] for patients greater     than or equal to 65 year old.     It is not intended     to diagnose infection nor to     guide or monitor treatment.     Studies: Dg Foot Complete Right  03/28/13  *RADIOLOGY REPORT*  Clinical Data: Fifth toe amputation.  Heel ulcer.  RIGHT FOOT COMPLETE - 3+ VIEW  Comparison: Os calcis 01/20/1939.  Findings: There is partial amputation of the fifth digit at the proximal interphalangeal joint.  Nondisplaced lucency is seen in the lateral head of the fifth proximal phalanx, with adjacent osseous fragments.  Degenerative changes are seen at the first metatarsal phalangeal joint with hallux valgus.  Scattered erosive changes are seen in the heads of the metatarsals.  Soft tissue ulceration overlies the dorsal calcaneus without underlying osseous erosion.  IMPRESSION:  1.  Soft tissue ulceration over the calcaneus without evidence of underlying osteomyelitis. 2.  Postoperative changes of partial amputation of the fifth digit. Lucency involving the head of the fifth proximal phalanx is suspicious for fracture.  Please correlate clinically. 3.  First metatarsal phalangeal joint osteoarthritis. 4.  Erosive changes in the heads of the metatarsals.   Original Report Authenticated By: Leanna Battles, M.D.     Scheduled Meds: . amLODipine  10 mg Oral Daily  . aspirin  81 mg Oral Daily  . atorvastatin  20 mg Oral q1800  . benazepril  40 mg Oral Daily  . carvedilol  50 mg Oral BID WC  . clopidogrel  75 mg Oral Daily  . doxazosin  8 mg Oral QHS  .  heparin  5,000 Units Subcutaneous Q8H  . insulin aspart  0-15 Units Subcutaneous TID WC  . insulin detemir  25 Units Subcutaneous QHS  . leflunomide  20 mg Oral Daily  . piperacillin-tazobactam (ZOSYN)  IV  3.375 g Intravenous Q8H  .  vancomycin  1,000 mg Intravenous Q12H   Continuous Infusions: . sodium chloride 75 mL/hr at 03/10/13 1610    Active Problems:   Diabetes   HTN (hypertension)   Osteomyelitis   Gangrene of toe   Cellulitis of leg, right    Time spent:    [facility]  [facility] Pager [phone]. If 7PM-7AM, please contact night-coverage at [email or web address], password [facility] 03/10/2013, 10:57 AM  LOS: 2 days

## 2013-03-10 NOTE — Progress Notes (Addendum)
ANTIBIOTIC CONSULT NOTE - Follow-up  Pharmacy Consult for Vancomycin and Zosyn Indication: R 5th toe amputation/ osteomyelitis  No Known Allergies  Patient Measurements: Height: 6' (182.9 cm) Weight: 202 lb 9.6 oz (91.899 kg) IBW/kg (Calculated) : 77.6 TBW: 91.8 kg  Vital Signs: Temp: 100.3 F (37.9 C) (04/11 1413) Temp src: Oral (04/11 1413) BP: 147/66 mmHg (04/11 1413) Pulse Rate: 81 (04/11 1413) Intake/Output from previous day: 04/10 0701 - 04/11 0700 In: 1190 [P.O.:360; I.V.:830] Out: 1460 [Urine:1460] Intake/Output from this shift: Total I/O In: 1602.5 [P.O.:120; I.V.:1182.5; IV Piggyback:300] Out: 700 [Urine:700]  Labs:  Recent Labs  03/08/13 1240 03/08/13 1304 03/10/13 0415  WBC 8.2  --  8.5  HGB 10.8* 12.2* 9.7*  PLT 201  --  191  CREATININE  --  0.90 0.95   Estimated Creatinine Clearance: 80.5 ml/min (by C-G formula based on Cr of 0.95).  Recent Labs  03/10/13 1310  VANCOTROUGH 15.1     Microbiology: Recent Results (from the past 720 hour(s))  WOUND CULTURE     Status: None   Collection Time    03/08/13  1:28 PM      Result Value Range Status   Specimen Description TOE RIGHT LITTLE TOE   Final   Special Requests NONE   Final   Gram Stain     Final   Value: NO WBC SEEN     NO SQUAMOUS EPITHELIAL CELLS SEEN     RARE GRAM POSITIVE COCCI     IN PAIRS   Culture     Final   Value: MULTIPLE ORGANISMS PRESENT, NONE PREDOMINANT     Note: NO STAPHYLOCOCCUS AUREUS ISOLATED NO GROUP A STREP (S.PYOGENES) ISOLATED   Report Status 03/10/2013 FINAL   Final  SURGICAL PCR SCREEN     Status: None   Collection Time    03/10/13  6:27 AM      Result Value Range Status   MRSA, PCR NEGATIVE  NEGATIVE Final   Staphylococcus aureus NEGATIVE  NEGATIVE Final   Comment:            The Xpert SA Assay (FDA     approved for NASAL specimens     in patients over 21 years of age),     is one component of     a comprehensive surveillance     program.  Test  performance has     been validated by [employer] for patients greater     than or equal to 67 year old.     It is not intended     to diagnose infection nor to     guide or monitor treatment.    Medical History: Past Medical History  Diagnosis Date  . Diabetes mellitus without complication   . Arthritis   . Hypertension    Medications:  Anti-infectives   Start     Dose/Rate Route Frequency Ordered Stop   03/09/13 0200  vancomycin (VANCOCIN) IVPB 1000 mg/200 mL premix     1,000 mg 200 mL/hr over 60 Minutes Intravenous Every 12 hours 03/08/13 1606     03/08/13 1600  piperacillin-tazobactam (ZOSYN) IVPB 3.375 g     3.375 g 12.5 mL/hr over 240 Minutes Intravenous Every 8 hours 03/08/13 1447     03/08/13 1430  vancomycin (VANCOCIN) IVPB 1000 mg/200 mL premix     1,000 mg 200 mL/hr over 60 Minutes Intravenous  Once 03/08/13 1341 03/08/13 1503     Assessment: 72 yoM  [facility]. R 5th toe gangrenous, hx of DM, concern for osteomyelitis. [facility] to [facility] for [facility]/stitches. Xray of foot: no evidence of osteomyelitis, MRI suggested. Plans per ortho for R 5th toe amputation.   4/9 >> Vanc >> 4/9 >> Zosyn >>   Tmax: 99.6 WBCs: 8.5 Renal: SCr = 0.95, 78N  4/9 Wound: multiple organisms, none predominate  Goal of Therapy:  Vancomycin trough 15-20 mcg/ml for suspected osteomyelitis  Plan:   Continue vancomycin 1gm IV q12h  Check trough today  Continue zosyn 3.375gm IV q8h with 4h infusion  Juliette Alcide, PharmD, BCPS.   Pager: [phone] 03/10/2013, 2:24 PM.   ADDENDUM Vancomycin trough = 15.20mcg/ml (prior to 5th dose of 1gm IV q12h)  Plan: vancomycin trough appropriate, continue vancomycin 1gm IV q12h  Juliette Alcide, PharmD, BCPS.   Pager: [phone] 03/10/2013 2:29 PM

## 2013-03-10 NOTE — Preoperative (Signed)
Beta Blockers   Reason not to administer Beta Blockers:Took Coreg this am. 

## 2013-03-10 NOTE — Transfer of Care (Signed)
Immediate Anesthesia Transfer of Care Note  Patient: Craig Woodard  Procedure(s) Performed: Procedure(s): amputaion right foot fifth ray with right heel skin graft (Right)  Patient Location: PACU  Anesthesia Type:MAC combined with regional for post-op pain  Level of Consciousness: awake, alert , oriented, patient cooperative and responds to stimulation  Airway & Oxygen Therapy: Patient Spontanous Breathing and Patient connected to face mask oxygen  Post-op Assessment: Report given to PACU RN and Post -op Vital signs reviewed and stable  Post vital signs: Reviewed and unstable  Complications: No apparent anesthesia complications

## 2013-03-10 NOTE — Plan of Care (Signed)
Problem: Phase I Progression Outcomes Goal: Initial discharge plan identified Outcome: Progressing progressing

## 2013-03-10 NOTE — Op Note (Signed)
OPERATIVE REPORT  DATE OF SURGERY: 03/10/2013  PATIENT:  Craig Woodard,  72 y.o. male  PRE-OPERATIVE DIAGNOSIS:  Gangrene right little toe.  ulceration right heel.  POST-OPERATIVE DIAGNOSIS:  Gangrene right little toe and right fourth toe. Osteomyelitis right calcaneus.  PROCEDURE:  Procedure(s): Amputation right foot fourth and fifth rays. Partial calcaneal excision. Application of allograft skin graft right heel 2 x 3" graft. Local tissue rearrangement for wound closure right heel wound 3 x 4 cm.  SURGEON:  Surgeon(s): Nadara Mustard, MD  ANESTHESIA:   general  EBL:  Minimal ML  SPECIMEN:  No Specimen  TOURNIQUET:    PROCEDURE DETAILS: Patient is a 72 year old gentleman diabetic insensate neuropathy peripheral vascular disease with gangrene of the right little toe necrotic ulcer of the right heel. Patient has had a vascular workup and not felt to require any type of revascularization. Do to the gangrene of the right little toe and chronic ulcer patient presents at this time for surgical intervention. Risks and benefits were discussed including infection neurovascular injury nonhealing of the wounds need for additional surgery. Patient states he understands and wishes to proceed at this time. Description of procedure patient brought to the operating room and underwent a an ankle block. After adequate levels and anesthesia obtained patient's right lower extremity was prepped using DuraPrep and draped into a sterile field. The fifth metatarsal and toe were resected in one block of tissue. Examination showed necrotic tissue surrounding the fourth metatarsal head the fourth metatarsal was then resected as well to include all the necrotic tissue. The wound was irrigated with normal saline hemostasis was obtained and the incision was closed using 2-0 nylon. The wound closed without any tension on the skin. Attention was then focused on the calcaneus. After gentle debridement there was  necrotic calcaneus exposed in the wound bed. Using an osteotome partial calcaneal excision was performed this is debrided back to healthy viable cancellus tissue of the calcaneus. The wound was irrigated the allograft tissue graft was then placed deep within the wound bed in the local tissue rearrangement was performed to close the wound including incorporation of the allograft skin graft. The wound closed well without complications. The wounds were covered with Mepitel 4 x 4's ABDs Kerlix and Coban. Patient was taken to the PACU in stable condition.  PLAN OF CARE: Admit to inpatient   PATIENT DISPOSITION:  PACU - hemodynamically stable.   Nadara Mustard, MD 03/10/2013 7:06 PM

## 2013-03-10 NOTE — Anesthesia Procedure Notes (Signed)
Anesthesia Regional Block:  Ankle block  Pre-Anesthetic Checklist: ,, timeout performed, Correct Patient, Correct Site, Correct Laterality, Correct Procedure, Correct Position, site marked, Risks and benefits discussed, Surgical consent,  Pre-op evaluation,  Post-op pain management  Laterality: Right  Prep: chloraprep       Needles:  Injection technique: Single-shot  Needle Type: Other   (22 gauge 2")        Additional Needles:  Procedures: other  ([place]) Ankle block Narrative:  Start time: 03/10/2013 5:50 PM End time: 03/10/2013 6:00 PM Injection made incrementally with aspirations every 5 mL.  Performed by: Personally  Anesthesiologist: Evah Rashid  Additional Notes: Ankle block proceeded without complication.

## 2013-03-10 NOTE — Progress Notes (Signed)
ANTIBIOTIC CONSULT NOTE - Follow-up  Pharmacy Consult for Vancomycin and Zosyn Indication: R 5th toe amputation/ osteomyelitis  No Known Allergies  Patient Measurements: Height: 6' (182.9 cm) Weight: 202 lb 9.6 oz (91.899 kg) IBW/kg (Calculated) : 77.6 TBW: 91.8 kg  Vital Signs: Temp: 98.1 F (36.7 C) (04/11 0509) Temp src: Oral (04/11 0509) BP: 125/72 mmHg (04/11 0509) Pulse Rate: 77 (04/11 0509) Intake/Output from previous day: 04/10 0701 - 04/11 0700 In: 1190 [P.O.:360; I.V.:830] Out: 1460 [Urine:1460] Intake/Output from this shift:    Labs:  Recent Labs  03/08/13 1240 03/08/13 1304 03/10/13 0415  WBC 8.2  --  8.5  HGB 10.8* 12.2* 9.7*  PLT 201  --  191  CREATININE  --  0.90 0.95   Estimated Creatinine Clearance: 80.5 ml/min (by C-G formula based on Cr of 0.95). No results found for this basename: VANCOTROUGH, VANCOPEAK, VANCORANDOM, GENTTROUGH, GENTPEAK, GENTRANDOM, TOBRATROUGH, TOBRAPEAK, TOBRARND, AMIKACINPEAK, AMIKACINTROU, AMIKACIN,  in the last 72 hours   Microbiology: Recent Results (from the past 720 hour(s))  WOUND CULTURE     Status: None   Collection Time    03/08/13  1:28 PM      Result Value Range Status   Specimen Description TOE RIGHT LITTLE TOE   Final   Special Requests NONE   Final   Gram Stain     Final   Value: NO WBC SEEN     NO SQUAMOUS EPITHELIAL CELLS SEEN     RARE GRAM POSITIVE COCCI     IN PAIRS   Culture     Final   Value: MULTIPLE ORGANISMS PRESENT, NONE PREDOMINANT     Note: NO STAPHYLOCOCCUS AUREUS ISOLATED NO GROUP A STREP (S.PYOGENES) ISOLATED   Report Status 03/10/2013 FINAL   Final    Medical History: Past Medical History  Diagnosis Date  . Diabetes mellitus without complication   . Arthritis   . Hypertension    Medications:  Anti-infectives   Start     Dose/Rate Route Frequency Ordered Stop   03/09/13 0200  vancomycin (VANCOCIN) IVPB 1000 mg/200 mL premix     1,000 mg 200 mL/hr over 60 Minutes Intravenous  Every 12 hours 03/08/13 1606     03/08/13 1600  piperacillin-tazobactam (ZOSYN) IVPB 3.375 g     3.375 g 12.5 mL/hr over 240 Minutes Intravenous Every 8 hours 03/08/13 1447     03/08/13 1430  vancomycin (VANCOCIN) IVPB 1000 mg/200 mL premix     1,000 mg 200 mL/hr over 60 Minutes Intravenous  Once 03/08/13 1341 03/08/13 1503     Assessment: Craig Woodard [facility]. R 5th toe gangrenous, hx of DM, concern for osteomyelitis. Presented to [facility] for wound care/stitches. Xray of foot: no evidence of osteomyelitis, MRI suggested. Plans per ortho for R 5th toe amputation.   4/9 >> Vanc >> 4/9 >> Zosyn >>   Tmax: 99.6 WBCs: 8.5 Renal: SCr = 0.95, 78N  4/9 Wound: multiple organisms, none predominate  Goal of Therapy:  Vancomycin trough 15-20 mcg/ml for suspected osteomyelitis  Plan:   Continue vancomycin 1gm IV q12h  Check trough today  Continue zosyn 3.375gm IV q8h with 4h infusion  Juliette Alcide, PharmD, BCPS.   Pager: [phone] 03/10/2013, 8:04 AM.

## 2013-03-10 NOTE — Progress Notes (Signed)
ANTICOAGULATION CONSULT NOTE - Initial Consult  Pharmacy Consult for Warfarin Indication: VTE Prophylaxis  No Known Allergies  Patient Measurements: Height: 6' (182.9 cm) Weight: 202 lb 9.6 oz (91.899 kg) IBW/kg (Calculated) : 77.6  Vital Signs: Temp: 99 F (37.2 C) (04/11 2005) Temp src: Oral (04/11 1718) BP: 146/Craig mmHg (04/11 2005) Pulse Rate: 76 (04/11 2005)  Labs:  Recent Labs  03/08/13 1240 03/08/13 1304 03/10/13 0415  HGB 10.8* 12.2* 9.7*  HCT 31.8* 36.0* 29.5*  PLT 201  --  191  CREATININE  --  0.90 0.95    Estimated Creatinine Clearance: 80.5 ml/min (by C-G formula based on Cr of 0.95).   Medical History: Past Medical History  Diagnosis Date  . Diabetes mellitus without complication   . Arthritis   . Hypertension     Medications:  Scheduled:  . amLODipine  10 mg Oral Daily  . aspirin  81 mg Oral Daily  . atorvastatin  20 mg Oral q1800  . benazepril  40 mg Oral Daily  . carvedilol  50 mg Oral BID WC  . clopidogrel  75 mg Oral Daily  . doxazosin  8 mg Oral QHS  . heparin  5,000 Units Subcutaneous Q8H  . insulin aspart  0-15 Units Subcutaneous TID WC  . insulin detemir  25 Units Subcutaneous QHS  . leflunomide  20 mg Oral Daily  . piperacillin-tazobactam (ZOSYN)  IV  3.375 g Intravenous Q8H  . vancomycin  1,000 mg Intravenous Q12H   Infusions:  . sodium chloride 75 mL/hr at 03/10/13 2010  . sodium chloride    . [DISCONTINUED] lactated ringers      Assessment: 72 yo Woodard admitted 03/08/13 with right 5th toe infection. 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 Plan is to start warfarin per Pharmacy for VTE prophylaxis. Will order baseline INR (not on warfarin prior to admission) and start warfarin tonight.  Goal of Therapy:  INR 2-3 Monitor platelets by anticoagulation protocol: Yes   Plan:  1) Warfarin 7.5mg  PO x1 tonight 2) Daily PT/INR 3) Warfarin education prior to discharge.  Darrol Angel, PharmD Pager:  [phone] 03/10/2013,8:25 PM

## 2013-03-10 NOTE — Anesthesia Preprocedure Evaluation (Addendum)
Anesthesia Evaluation  Patient identified by MRN, date of birth, ID band Patient awake    Reviewed: Allergy & Precautions, H&P , NPO status , Patient's Chart, lab work & pertinent test results  Airway Mallampati: II TM Distance: >3 FB Neck ROM: Full    Dental  (+) Dental Advisory Given, Teeth Intact and Missing   Pulmonary former smoker,  breath sounds clear to auscultation        Cardiovascular hypertension, Pt. on medications Rhythm:Regular Rate:Normal     Neuro/Psych negative neurological ROS  negative psych ROS   GI/Hepatic negative GI ROS, Neg liver ROS,   Endo/Other  diabetes, Type 2, Oral Hypoglycemic Agents  Renal/GU negative Renal ROS     Musculoskeletal negative musculoskeletal ROS (+)   Abdominal   Peds  Hematology negative hematology ROS (+)   Anesthesia Other Findings   Reproductive/Obstetrics                         Anesthesia Physical Anesthesia Plan  ASA: III  Anesthesia Plan: MAC and Regional   Post-op Pain Management:    Induction: Intravenous  Airway Management Planned: Simple Face Mask and Nasal Cannula  Additional Equipment:   Intra-op Plan:   Post-operative Plan:   Informed Consent: I have reviewed the patients History and Physical, chart, labs and discussed the procedure including the risks, benefits and alternatives for the proposed anesthesia with the patient or authorized representative who has indicated his/her understanding and acceptance.   Dental advisory given  Plan Discussed with: CRNA  Anesthesia Plan Comments:        Anesthesia Quick Evaluation

## 2013-03-11 LAB — GLUCOSE, CAPILLARY
Glucose-Capillary: 213 mg/dL — ABNORMAL HIGH (ref 70–99)
Glucose-Capillary: 170 mg/dL — ABNORMAL HIGH (ref 70–99)
Glucose-Capillary: 208 mg/dL — ABNORMAL HIGH (ref 70–99)

## 2013-03-11 MED ORDER — WARFARIN SODIUM 7.5 MG PO TABS
7.5000 mg | ORAL_TABLET | Freq: Once | ORAL | Status: AC
  Administered 2013-03-11: 7.5 mg via ORAL
  Filled 2013-03-11: qty 1

## 2013-03-11 MED ORDER — POLYETHYLENE GLYCOL 3350 17 G PO PACK
17.0000 g | PACK | Freq: Two times a day (BID) | ORAL | Status: DC
  Administered 2013-03-11: 17 g via ORAL
  Filled 2013-03-11 (×9): qty 1

## 2013-03-11 MED ORDER — SENNOSIDES-DOCUSATE SODIUM 8.6-50 MG PO TABS
1.0000 | ORAL_TABLET | Freq: Every day | ORAL | Status: DC
  Administered 2013-03-11 – 2013-03-14 (×3): 1 via ORAL
  Filled 2013-03-11 (×4): qty 1

## 2013-03-11 NOTE — Progress Notes (Signed)
Subjective: 1 Day Post-Op Procedure(s) (LRB): amputaion right foot fifth ray with right heel skin graft (Right) Patient reports pain as moderate.    Objective: Vital signs in last 24 hours: Temp:  [98.7 F (37.1 C)-100.3 F (37.9 C)] 99.1 F (37.3 C) (04/12 0545) Pulse Rate:  [76-97] 89 (04/12 0545) Resp:  [12-18] 16 (04/12 0545) BP: (120-175)/(61-80) 149/67 mmHg (04/12 0545) SpO2:  [93 %-100 %] 93 % (04/12 0545)  Intake/Output from previous day: 04/11 0701 - 04/12 0700 In: 4053.8 [P.O.:600; I.V.:2903.8; IV Piggyback:550] Out: 1900 [Urine:1900] Intake/Output this shift:     Recent Labs  03/08/13 1240 03/08/13 1304 03/10/13 0415  HGB 10.8* 12.2* 9.7*    Recent Labs  03/08/13 1240 03/08/13 1304 03/10/13 0415  WBC 8.2  --  8.5  RBC 3.90*  --  3.65*  HCT 31.8* 36.0* 29.5*  PLT 201  --  191    Recent Labs  03/08/13 1304 03/10/13 0415  NA 140 137  K 3.7 3.4*  CL 101 99  CO2  --  28  BUN 11 13  CREATININE 0.90 0.95  GLUCOSE 282* 203*  CALCIUM  --  9.1    Recent Labs  03/10/13 2059 03/11/13 0530  INR 1.19 1.27    Neurologically intact Incision: dressing C/D/I Compartment soft  Assessment/Plan: 1 Day Post-Op Procedure(s) (LRB): amputaion right foot fifth ray with right heel skin graft (Right) Up with therapy non weight right leg Keep dressing clean dry and intact Float heels  Matteson Blue 03/11/2013, 11:28 AM

## 2013-03-11 NOTE — Evaluation (Signed)
Physical Therapy Evaluation Patient Details Name: Craig Woodard MRN: [id number] DOB: Apr 30, 1941 Today's Date: 03/11/2013 Time: 4098-1191 PT Time Calculation (min): 19 min  PT Assessment / Plan / Recommendation Clinical Impression  Pt s/p R heel skin graft and R 5th toe amputation due to gangrene.  Pt reports he has been going to wound center for 6 months with no results so required surgery.  Pt would benefit from acute PT services in order to improve independence with mobility and provide education on assistive devices.  Pt with poor ability to maintain NWB and unable to step so may benefit more from w/c training at this time.    PT Assessment  Patient needs continued PT services    Follow Up Recommendations  Supervision/Assistance - 24 hour;SNF    Does the patient have the potential to tolerate intense rehabilitation      Barriers to Discharge        Equipment Recommendations  Wheelchair (measurements PT);Wheelchair cushion (measurements PT)    Recommendations for Other Services     Frequency Min 3X/week    Precautions / Restrictions Precautions Precautions: Fall Restrictions Weight Bearing Restrictions: Yes RLE Weight Bearing: Non weight bearing Other Position/Activity Restrictions: has shoe from wound center prior to surgery, recommend wearing with transfers due to poor adherence to NWB   Pertinent Vitals/Pain Pt reports pain in R LE with mobility and aware to call out for pain meds (states he received tylenol earlier)      Mobility  Bed Mobility Bed Mobility: Supine to Sit Supine to Sit: 5: Supervision;HOB elevated Details for Bed Mobility Assistance: verbal cues for technique Transfers Transfers: Stand to Sit;Stand Pivot Transfers;Sit to Stand Sit to Stand: 1: +2 Total assist;With upper extremity assist;From elevated surface Sit to Stand: Patient Percentage: 50% Stand to Sit: 1: +2 Total assist;With upper extremity assist Stand to Sit: Patient Percentage:  50% Stand Pivot Transfers: 1: +2 Total assist Stand Pivot Transfers: Patient Percentage: 50% Details for Transfer Assistance: verbal cues for technique, shoe for R foot donned in case of WB, pt with poor ability to maintain NWB status so only transferred to chair, pt also unable to take step today so demonstrated pivoting under L LE, increased assist for balance and weakness Ambulation/Gait Ambulation/Gait Assistance: Not tested (comment)    Exercises     PT Diagnosis: Generalized weakness;Difficulty walking;Acute pain  PT Problem List: Decreased strength;Decreased activity tolerance;Decreased balance;Decreased mobility;Decreased knowledge of precautions;Decreased safety awareness;Decreased knowledge of use of DME;Pain PT Treatment Interventions: DME instruction;Gait training;Patient/family education;Wheelchair mobility training;Therapeutic activities;Therapeutic exercise;Functional mobility training   PT Goals Acute Rehab PT Goals PT Goal Formulation: With patient Time For Goal Achievement: 03/25/13 Potential to Achieve Goals: Good Pt will go Sit to Stand: with min assist PT Goal: Sit to Stand - Progress: Goal set today Pt will go Stand to Sit: with min assist PT Goal: Stand to Sit - Progress: Goal set today Pt will Transfer Bed to Chair/Chair to Bed: with supervision PT Transfer Goal: Bed to Chair/Chair to Bed - Progress: Goal set today Pt will Ambulate: 1 - 15 feet;with min assist;with rolling walker PT Goal: Ambulate - Progress: Goal set today Pt will Propel Wheelchair: > 150 feet;with modified independence PT Goal: Propel Wheelchair - Progress: Goal set today  Visit Information  Last PT Received On: 03/11/13 Assistance Needed: +2    Subjective Data  Subjective: I cant do it. (walking)   Prior Functioning  Home Living Lives With: Spouse Type of Home: House Home Access: Level entry  Home Layout: One level Home Adaptive Equipment: Walker - rolling;Walker - four  wheeled;Straight cane;Crutches Prior Function Level of Independence: Independent with assistive device(s) Comments: Pt states he used SPC prior to surgery Communication Communication: No difficulties    Cognition  Cognition Overall Cognitive Status: Appears within functional limits for tasks assessed/performed Arousal/Alertness: Awake/alert Orientation Level: Appears intact for tasks assessed Behavior During Session: [facility] for tasks performed    Extremity/Trunk Assessment Right Upper Extremity Assessment RUE ROM/Strength/Tone: Deficits RUE ROM/Strength/Tone Deficits: functional weakness observed Left Upper Extremity Assessment LUE ROM/Strength/Tone: Deficits LUE ROM/Strength/Tone Deficits: functional weakness observed Right Lower Extremity Assessment RLE ROM/Strength/Tone: Deficits RLE ROM/Strength/Tone Deficits: due to NWB, able to move LE against gravity so at least 3/5 throughout Left Lower Extremity Assessment LLE ROM/Strength/Tone: Deficits LLE ROM/Strength/Tone Deficits: functional weakness observed   Balance    End of Session PT - End of Session Activity Tolerance: Patient limited by pain;Patient limited by fatigue Patient left: in chair;with call bell/phone within reach Nurse Communication: Other (comment);Weight bearing status (WB status written on white board)  GP     Chavela Justiniano,KATHrine E 03/11/2013, 1:02 PM Zenovia Jarred, PT, DPT 03/11/2013 Pager: [phone]

## 2013-03-11 NOTE — Progress Notes (Signed)
Pt reports that Vicodin only hold his pain for an hour and a half, and then he needs something else.  Needs another kind of PO med.

## 2013-03-11 NOTE — Progress Notes (Signed)
Pt c/o not having a good bm in several days.  Reported he had a small one this am.  Requests laxative.

## 2013-03-11 NOTE — Progress Notes (Signed)
Clinical Social Work Department CLINICAL SOCIAL WORK PLACEMENT NOTE 03/11/2013  Patient:  Craig Woodard, Craig Woodard  Account Number:  [id number] Admit date:  03/08/2013  Clinical Social Worker:  Doroteo Glassman  Date/time:  03/11/2013 03:36 PM  Clinical Social Work is seeking post-discharge placement for this patient at the following level of care:   SKILLED NURSING   (*CSW will update this form in Epic as items are completed)   03/11/2013  Patient/family provided with [facility] Department of Clinical Social Work's list of facilities offering this level of care within the geographic area requested by the patient (or if unable, by the patient's family).  03/11/2013  Patient/family informed of their freedom to choose among providers that offer the needed level of care, that participate in Medicare, Medicaid or managed care program needed by the patient, have an available bed and are willing to accept the patient.  03/11/2013  Patient/family informed of [facility]' ownership interest in [facility], as well as of the fact that they are under no obligation to receive care at this facility.  PASARR submitted to EDS on 03/11/2013 PASARR number received from EDS on 03/11/2013  FL2 transmitted to all facilities in geographic area requested by pt/family on  03/11/2013 FL2 transmitted to all facilities within larger geographic area on   Patient informed that his/her managed care company has contracts with or will negotiate with  certain facilities, including the following:     Patient/family informed of bed offers received:   Patient chooses bed at  Physician recommends and patient chooses bed at    Patient to be transferred to  on   Patient to be transferred to facility by   The following physician request were entered in Epic:   Additional Comments:  Providence Crosby, Theresia Majors Clinical Social Work [phone]

## 2013-03-11 NOTE — Progress Notes (Signed)
ANTICOAGULATION CONSULT NOTE - Follow Up Consult  Pharmacy Consult for Warfarin Indication: VTE prophylaxis  No Known Allergies  Patient Measurements: Height: 6' (182.9 cm) Weight: 202 lb 9.6 oz (91.899 kg) IBW/kg (Calculated) : 77.6  Vital Signs: Temp: 99.1 F (37.3 C) (04/12 0545) Temp src: Oral (04/12 0545) BP: 149/67 mmHg (04/12 0545) Pulse Rate: 89 (04/12 0545)  Labs:  Recent Labs  03/10/13 0415 03/10/13 2059 03/11/13 0530  HGB 9.7*  --   --   HCT 29.5*  --   --   PLT 191  --   --   LABPROT  --  14.9 15.6*  INR  --  1.19 1.27  CREATININE 0.95  --   --    Estimated Creatinine Clearance: 80.5 ml/min (by C-G formula based on Cr of 0.95).  Medications:  Scheduled:  . amLODipine  10 mg Oral Daily  . atorvastatin  20 mg Oral q1800  . benazepril  40 mg Oral Daily  . carvedilol  50 mg Oral BID WC  . clopidogrel  75 mg Oral Daily  . doxazosin  8 mg Oral QHS  . heparin  5,000 Units Subcutaneous Q8H  . insulin aspart  0-15 Units Subcutaneous TID WC  . insulin detemir  25 Units Subcutaneous QHS  . leflunomide  20 mg Oral Daily  . piperacillin-tazobactam (ZOSYN)  IV  3.375 g Intravenous Q8H  . polyethylene glycol  17 g Oral BID  . senna-docusate  1 tablet Oral Daily  . vancomycin  1,000 mg Intravenous Q12H  . [COMPLETED] warfarin  7.5 mg Oral Once  . Warfarin - Pharmacist Dosing Inpatient   Does not apply q1800  . [DISCONTINUED] aspirin  81 mg Oral Daily   Assessment: 72 yo M admitted 03/08/13 with partial [facility]. Amputation of R 4th & 5th rays, calcaneous excision with skin graft on 4/11. Hx of PVD with stents x 2, insulin dep DM.  Pharmacy asked to dose Warfarin for VTE prophylaxis.  Concurrent Heparin 5000 units SQ q8 from 4/9  PTA on Plavix and ASA 81mg  daily; meds resumed  Risk of bleed with anti-platelet + SQ Heparin + Warfarin.  Anticipate d/c of Heparin with therapeutic INR  INR 1.27 after 7.5mg  Warfarin  Goal of  Therapy:  INR 2-3   Plan:  Warfarin 7.5mg  @ 1800 Daily PT/INR Warfarin education prior to discharge.  Otho Bellows PharmD Pager [phone] 03/11/2013, 1:25 PM

## 2013-03-11 NOTE — Progress Notes (Signed)
Clinical Social Work Department BRIEF PSYCHOSOCIAL ASSESSMENT 03/11/2013  Patient:  KYLOR, Craig Woodard     Account Number:  [id number]     Admit date:  03/08/2013  Clinical Social Worker:  Doroteo Glassman  Date/Time:  03/11/2013 03:32 PM  Referred by:  Physician  Date Referred:  03/11/2013 Referred for  SNF Placement   Other Referral:   Interview type:  Patient Other interview type:   Wife at bedside    PSYCHOSOCIAL DATA Living Status:  WIFE Admitted from facility:   Level of care:   Primary support name:  Mrs. Mccullers Primary support relationship to patient:  SPOUSE Degree of support available:   Good    CURRENT CONCERNS Current Concerns  Post-Acute Placement   Other Concerns:    SOCIAL WORK ASSESSMENT / PLAN Met with Pt and wife to discuss d/c plans.    Pt agreeable to SNF search in [place] and [place], with the hopes of being placed at [facility].  Pt would rather d/c home but understands the importance of SNF and is agreeable to letting CSW start the SNF search.    Provided Pt and wife with SNF lists for [place] and [place].    CSW thanked Pt and his wife for their time.   Assessment/plan status:  Psychosocial Support/Ongoing Assessment of Needs Other assessment/ plan:   Information/referral to community resources:   SNF lists for [place] and [employer].    PATIENT'S/FAMILY'S RESPONSE TO PLAN OF CARE: Pt and wife thanked CSW for time and assistance.   Providence Crosby, LCSWA Clinical Social Work [phone]

## 2013-03-11 NOTE — Progress Notes (Addendum)
[facility] PROGRESS NOTE  Craig Woodard:[id number] DOB: [birth year] DOA: 03/08/2013 PCP: Redmond Baseman, MD  Assessment/Plan: 1. R toe Gangrene with surrounding mild cellulitis changes, partial amputation by Dr.PArker at wound center 4/9 - Continue IV Vanc/Zosyn  - FU cultures from OR - Greatly appreciate Dr.DUda's assistance s/p amputation of 4/5th toes and skin graft to R heel wound 4/11 -started on Po coumadin per Dr.Duda for VTE prophylaxis   2. DM: stable -continue Levemir , SSI -Hbaic 8.6  3. H/o RA on Leflunomide  4. HTN: continue benazepril and amlodipine  5. PVD s/p PCI -- h/o PCI and stenting of R leg vasculature by Dr.Payvar at [facility],  -continue plavix -requested records from [facility]  6. Constipation -miralax and senokot  Code Status: FUll  Family Communication: none at bedside Disposition Plan: home when improved  Procedure: 4/11 Amputation right foot fourth and fifth rays.  Partial calcaneal excision.  Application of allograft skin graft right heel 2 x 3" graft.  Local tissue rearrangement for wound closure right heel wound 3 x 4 cm   Consultants:  VVS Dr.Early  Dr.Duda, ORthopedics  Antibiotics:  Vanc 4/9  Zosyn 4/9  HPI/Subjective: Awaiting surgery, no BM yet, wants to wait till afetr surgery for laxatives  Objective: Filed Vitals:   03/10/13 2212 03/10/13 2315 03/11/13 0231 03/11/13 0545  BP: 134/68 136/68 144/70 149/67  Pulse: 79 80 85 89  Temp: 98.8 F (37.1 C) 98.7 F (37.1 C) 98.8 F (37.1 C) 99.1 F (37.3 C)  TempSrc:   Oral Oral  Resp: 16 16 16 16   Height:      Weight:      SpO2: 98% 97% 93% 93%    Intake/Output Summary (Last 24 hours) at 03/11/13 1010 Last data filed at 03/11/13 0547  Gross per 24 hour  Intake 2751.25 ml  Output   1700 ml  Net 1051.25 ml   Filed Weights   03/08/13 1537  Weight: 91.899 kg (202 lb 9.6 oz)    Exam: General: NAD  Eyes: PERRL, EOMI  ENT: moist oropharynx  Neck:  supple, no JVD  Cardiovascular: RRR without MRG  Respiratory: CTA biL, good air movement without wheezing, rhonchi or crackled  Abdomen: soft, non tender to palpation, positive bowel sounds, no guarding, no rebound  Skin: no rashes  Musculoskeletal: no peripheral edema. Dry gangrene of proximal Right fifth toe with majority of toe missing, bone exposed, cellulitic changes are present surrounding the amputation site Large 2x2 cm heel ulcer, 1cm deep with some purulence Psychiatric: normal mood and affect  Neurologic: CN 2-12 grossly intact, MS 5/5 in all 4    Data Reviewed: Basic Metabolic Panel:  Recent Labs Lab 03/08/13 1304 03/10/13 0415  NA 140 137  K 3.7 3.4*  CL 101 99  CO2  --  28  GLUCOSE 282* 203*  BUN 11 13  CREATININE 0.90 0.95  CALCIUM  --  9.1   Liver Function Tests: No results found for this basename: AST, ALT, ALKPHOS, BILITOT, PROT, ALBUMIN,  in the last 168 hours No results found for this basename: LIPASE, AMYLASE,  in the last 168 hours No results found for this basename: AMMONIA,  in the last 168 hours CBC:  Recent Labs Lab 03/08/13 1240 03/08/13 1304 03/10/13 0415  WBC 8.2  --  8.5  NEUTROABS 6.6  --   --   HGB 10.8* 12.2* 9.7*  HCT 31.8* 36.0* 29.5*  MCV 81.5  --  80.8  PLT 201  --  191   Cardiac Enzymes: No results found for this basename: CKTOTAL, CKMB, CKMBINDEX, TROPONINI,  in the last 168 hours BNP (last 3 results) No results found for this basename: PROBNP,  in the last 8760 hours CBG:  Recent Labs Lab 03/10/13 1129 03/10/13 1833 03/10/13 1928 03/10/13 2136 03/11/13 0723  GLUCAP 116* 110* 100* 166* 120*    Recent Results (from the past 240 hour(s))  WOUND CULTURE     Status: None   Collection Time    03/08/13  1:28 PM      Result Value Range Status   Specimen Description TOE RIGHT LITTLE TOE   Final   Special Requests NONE   Final   Gram Stain     Final   Value: NO WBC SEEN     NO SQUAMOUS EPITHELIAL CELLS SEEN     RARE  GRAM POSITIVE COCCI     IN PAIRS   Culture     Final   Value: MULTIPLE ORGANISMS PRESENT, NONE PREDOMINANT     Note: NO STAPHYLOCOCCUS AUREUS ISOLATED NO GROUP A STREP (S.PYOGENES) ISOLATED   Report Status 03/10/2013 FINAL   Final  SURGICAL PCR SCREEN     Status: None   Collection Time    03/10/13  6:27 AM      Result Value Range Status   MRSA, PCR NEGATIVE  NEGATIVE Final   Staphylococcus aureus NEGATIVE  NEGATIVE Final   Comment:            The Xpert SA Assay (FDA     approved for NASAL specimens     in patients over 31 years of age),     is one component of     a comprehensive surveillance     program.  Test performance has     been validated by [employer] for patients greater     than or equal to 27 year old.     It is not intended     to diagnose infection nor to     guide or monitor treatment.     Studies: No results found.  Scheduled Meds: . amLODipine  10 mg Oral Daily  . atorvastatin  20 mg Oral q1800  . benazepril  40 mg Oral Daily  . carvedilol  50 mg Oral BID WC  . clopidogrel  75 mg Oral Daily  . doxazosin  8 mg Oral QHS  . heparin  5,000 Units Subcutaneous Q8H  . insulin aspart  0-15 Units Subcutaneous TID WC  . insulin detemir  25 Units Subcutaneous QHS  . leflunomide  20 mg Oral Daily  . piperacillin-tazobactam (ZOSYN)  IV  3.375 g Intravenous Q8H  . polyethylene glycol  17 g Oral BID  . senna-docusate  1 tablet Oral Daily  . vancomycin  1,000 mg Intravenous Q12H  . Warfarin - Pharmacist Dosing Inpatient   Does not apply q1800   Continuous Infusions: . sodium chloride 75 mL/hr at 03/11/13 0829  . sodium chloride      Active Problems:   Diabetes   HTN (hypertension)   Osteomyelitis   Gangrene of toe   Cellulitis of leg, right    Time spent:    [facility]  [facility] Pager [phone]. If 7PM-7AM, please contact night-coverage at [email or web address], password [facility] 03/11/2013, 10:10 AM  LOS: 3 days

## 2013-03-12 LAB — CBC
Hemoglobin: 8.5 g/dL — ABNORMAL LOW (ref 13.0–17.0)
MCV: 80.7 fL (ref 78.0–100.0)
Platelets: 216 10*3/uL (ref 150–400)
RBC: 3.11 MIL/uL — ABNORMAL LOW (ref 4.22–5.81)
WBC: 6.6 10*3/uL (ref 4.0–10.5)

## 2013-03-12 LAB — BASIC METABOLIC PANEL
CO2: 27 mEq/L (ref 19–32)
Chloride: 98 mEq/L (ref 96–112)
Sodium: 136 mEq/L (ref 135–145)

## 2013-03-12 LAB — GLUCOSE, CAPILLARY
Glucose-Capillary: 155 mg/dL — ABNORMAL HIGH (ref 70–99)
Glucose-Capillary: 136 mg/dL — ABNORMAL HIGH (ref 70–99)

## 2013-03-12 LAB — PROTIME-INR: Prothrombin Time: 16.5 seconds — ABNORMAL HIGH (ref 11.6–15.2)

## 2013-03-12 MED ORDER — WARFARIN SODIUM 7.5 MG PO TABS
7.5000 mg | ORAL_TABLET | Freq: Once | ORAL | Status: AC
  Administered 2013-03-12: 7.5 mg via ORAL
  Filled 2013-03-12: qty 1

## 2013-03-12 MED ORDER — POTASSIUM CHLORIDE CRYS ER 20 MEQ PO TBCR
40.0000 meq | EXTENDED_RELEASE_TABLET | Freq: Once | ORAL | Status: AC
  Administered 2013-03-12: 40 meq via ORAL
  Filled 2013-03-12: qty 2

## 2013-03-12 MED ORDER — INSULIN DETEMIR 100 UNIT/ML ~~LOC~~ SOLN
30.0000 [IU] | Freq: Every day | SUBCUTANEOUS | Status: DC
  Administered 2013-03-12 – 2013-03-13 (×2): 30 [IU] via SUBCUTANEOUS
  Filled 2013-03-12 (×2): qty 0.3

## 2013-03-12 NOTE — Anesthesia Postprocedure Evaluation (Signed)
Anesthesia Post Note  Patient: Craig Woodard  Procedure(s) Performed: Procedure(s) (LRB): amputaion right foot fifth ray with right heel skin graft (Right)  Anesthesia type: MAC  Patient location: PACU  Post pain: Pain level controlled  Post assessment: Post-op Vital signs reviewed  Last Vitals: BP 139/62  Pulse 86  Temp(Src) 37.4 C (Oral)  Resp 18  Ht 6' (1.829 m)  Wt 202 lb 9.6 oz (91.899 kg)  BMI 27.47 kg/m2  SpO2 96%  Post vital signs: Reviewed  Level of consciousness: awake  Complications: No apparent anesthesia complications

## 2013-03-12 NOTE — Progress Notes (Addendum)
[facility] PROGRESS NOTE  Craig Woodard WJX:[id number] DOB: 10-12-1941 DOA: 03/08/2013 PCP: Redmond Baseman, MD  Assessment/Plan: 1. R toe Gangrene with surrounding mild cellulitis changes, partial amputation by Dr.PArker at wound center 4/9 - Continue IV Vanc/Zosyn Day5, change to PO tomorrow if ok with Dr.Duda - FU cultures from OR - Greatly appreciate Dr.DUda's assistance s/p amputation of 4/5th toes and skin graft to R heel wound 4/11 -  On Po coumadin per Dr.Duda for VTE prophylaxis - wound care   2. DM: stable -continue Levemir increase dose , SSI -Hbaic 8.6  3. H/o RA on Leflunomide  4. HTN: continue benazepril and amlodipine  5. PVD s/p PCI -- h/o PCI and stenting of R leg vasculature by Dr[facility]Payvar at [facility],  -continue plavix -requested records from [facility]  6. Constipation -miralax and senokot  Code Status: FUll  Family Communication: none at bedside Disposition Plan: SNF soon  Procedure: 4/11 Amputation right foot fourth and fifth rays.  Partial calcaneal excision.  Application of allograft skin graft right heel 2 x 3" graft.  Local tissue rearrangement for wound closure right heel wound 3 x 4 cm   Consultants:  VVS Dr.Early  Dr.Duda, ORthopedics  Antibiotics:  Vanc 4/9  Zosyn 4/9  HPI/Subjective: Awaiting surgery, no BM yet, wants to wait till afetr surgery for laxatives  Objective: Filed Vitals:   03/11/13 1438 03/11/13 2216 03/12/13 0522 03/12/13 0857  BP: 122/65 127/64 158/96 139/62  Pulse: 75 85 86   Temp: 98.9 F (37.2 C) 98.9 F (37.2 C) 99.3 F (37.4 C)   TempSrc: Oral Oral Oral   Resp: 16 16 18    Height:      Weight:      SpO2: 97% 95% 96%     Intake/Output Summary (Last 24 hours) at 03/12/13 1018 Last data filed at 03/12/13 0600  Gross per 24 hour  Intake   1500 ml  Output    550 ml  Net    950 ml   Filed Weights   03/08/13 1537  Weight: 91.899 kg (202 lb 9.6 oz)    Exam: General: NAD  Eyes: PERRL,  EOMI  ENT: moist oropharynx  Neck: supple, no JVD  Cardiovascular: RRR without MRG  Respiratory: CTA biL, good air movement without wheezing, rhonchi or crackled  Abdomen: soft, non tender to palpation, positive bowel sounds, no guarding, no rebound  Skin: no rashes  Musculoskeletal: no peripheral edema. Dry gangrene of proximal Right fifth toe with majority of toe missing, bone exposed, cellulitic changes are present surrounding the amputation site Large 2x2 cm heel ulcer, 1cm deep with some purulence Psychiatric: normal mood and affect  Neurologic: CN 2-12 grossly intact, MS 5/5 in all 4    Data Reviewed: Basic Metabolic Panel:  Recent Labs Lab 03/08/13 1304 03/10/13 0415 03/12/13 0706  NA 140 137 136  K 3.7 3.4* 3.0*  CL 101 99 98  CO2  --  28 27  GLUCOSE 282* 203* 181*  BUN 11 13 9   CREATININE 0.90 0.95 0.87  CALCIUM  --  9.1 8.5   Liver Function Tests: No results found for this basename: AST, ALT, ALKPHOS, BILITOT, PROT, ALBUMIN,  in the last 168 hours No results found for this basename: LIPASE, AMYLASE,  in the last 168 hours No results found for this basename: AMMONIA,  in the last 168 hours CBC:  Recent Labs Lab 03/08/13 1240 03/08/13 1304 03/10/13 0415 03/12/13 0706  WBC 8.2  --  8.5 6.6  NEUTROABS  6.6  --   --   --   HGB 10.8* 12.2* 9.7* 8.5*  HCT 31.8* 36.0* 29.5* 25.1*  MCV 81.5  --  80.8 80.7  PLT 201  --  191 216   Cardiac Enzymes: No results found for this basename: CKTOTAL, CKMB, CKMBINDEX, TROPONINI,  in the last 168 hours BNP (last 3 results) No results found for this basename: PROBNP,  in the last 8760 hours CBG:  Recent Labs Lab 03/11/13 0723 03/11/13 1227 03/11/13 1802 03/11/13 2223 03/12/13 0708  GLUCAP 120* 213* 170* 208* 155*    Recent Results (from the past 240 hour(s))  WOUND CULTURE     Status: None   Collection Time    03/08/13  1:28 PM      Result Value Range Status   Specimen Description TOE RIGHT LITTLE TOE   Final    Special Requests NONE   Final   Gram Stain     Final   Value: NO WBC SEEN     NO SQUAMOUS EPITHELIAL CELLS SEEN     RARE GRAM POSITIVE COCCI     IN PAIRS   Culture     Final   Value: MULTIPLE ORGANISMS PRESENT, NONE PREDOMINANT     Note: NO STAPHYLOCOCCUS AUREUS ISOLATED NO GROUP A STREP (S.PYOGENES) ISOLATED   Report Status 03/10/2013 FINAL   Final  SURGICAL PCR SCREEN     Status: None   Collection Time    03/10/13  6:27 AM      Result Value Range Status   MRSA, PCR NEGATIVE  NEGATIVE Final   Staphylococcus aureus NEGATIVE  NEGATIVE Final   Comment:            The Xpert SA Assay (FDA     approved for NASAL specimens     in patients over 50 years of age),     is one component of     a comprehensive surveillance     program.  Test performance has     been validated by [employer] for patients greater     than or equal to 59 year old.     It is not intended     to diagnose infection nor to     guide or monitor treatment.     Studies: No results found.  Scheduled Meds: . amLODipine  10 mg Oral Daily  . atorvastatin  20 mg Oral q1800  . benazepril  40 mg Oral Daily  . carvedilol  50 mg Oral BID WC  . clopidogrel  75 mg Oral Daily  . doxazosin  8 mg Oral QHS  . heparin  5,000 Units Subcutaneous Q8H  . insulin aspart  0-15 Units Subcutaneous TID WC  . insulin detemir  30 Units Subcutaneous QHS  . leflunomide  20 mg Oral Daily  . piperacillin-tazobactam (ZOSYN)  IV  3.375 g Intravenous Q8H  . polyethylene glycol  17 g Oral BID  . potassium chloride  40 mEq Oral Once  . senna-docusate  1 tablet Oral Daily  . vancomycin  1,000 mg Intravenous Q12H  . warfarin  7.5 mg Oral ONCE-1800  . Warfarin - Pharmacist Dosing Inpatient   Does not apply q1800   Continuous Infusions: . sodium chloride 75 mL/hr at 03/11/13 0829  . sodium chloride      Active Problems:   Diabetes   HTN (hypertension)   Osteomyelitis   Gangrene of toe   Cellulitis of leg, right  Time  spent:    [facility]  [facility] Pager [phone]. If 7PM-7AM, please contact night-coverage at [email or web address], password [facility] 03/12/2013, 10:18 AM  LOS: 4 days

## 2013-03-12 NOTE — Progress Notes (Signed)
IV team nurse on floor, assessed IV states is ok for continued use, no longer any leaking noted. Will continue to monitor.

## 2013-03-12 NOTE — Progress Notes (Signed)
ANTICOAGULATION CONSULT NOTE - Follow Up Consult  Pharmacy Consult for Warfarin Indication: VTE prophylaxis  No Known Allergies  Patient Measurements: Height: 6' (182.9 cm) Weight: 202 lb 9.6 oz (91.899 kg) IBW/kg (Calculated) : 77.6  Vital Signs: Temp: 99.3 F (37.4 C) (04/13 0522) Temp src: Oral (04/13 0522) BP: 139/62 mmHg (04/13 0857) Pulse Rate: 86 (04/13 0522)  Labs:  Recent Labs  03/10/13 0415 03/10/13 2059 03/11/13 0530 03/12/13 0706  HGB 9.7*  --   --  8.5*  HCT 29.5*  --   --  25.1*  PLT 191  --   --  216  LABPROT  --  14.9 15.6* 16.5*  INR  --  1.19 1.27 1.37  CREATININE 0.95  --   --  0.87   Estimated Creatinine Clearance: 88 ml/min (by C-G formula based on Cr of 0.87).  Medications:  Scheduled:  . amLODipine  10 mg Oral Daily  . atorvastatin  20 mg Oral q1800  . benazepril  40 mg Oral Daily  . carvedilol  50 mg Oral BID WC  . clopidogrel  75 mg Oral Daily  . doxazosin  8 mg Oral QHS  . heparin  5,000 Units Subcutaneous Q8H  . insulin aspart  0-15 Units Subcutaneous TID WC  . insulin detemir  25 Units Subcutaneous QHS  . leflunomide  20 mg Oral Daily  . piperacillin-tazobactam (ZOSYN)  IV  3.375 g Intravenous Q8H  . polyethylene glycol  17 g Oral BID  . senna-docusate  1 tablet Oral Daily  . vancomycin  1,000 mg Intravenous Q12H  . [COMPLETED] warfarin  7.5 mg Oral ONCE-1800  . Warfarin - Pharmacist Dosing Inpatient   Does not apply q1800   Assessment: 72 yo M admitted 03/08/13 with [facility]. Amputation of R 4th & 5th rays, calcaneous excision with skin graft on 4/11. Hx of PVD with stents x 2, insulin dep DM.  Pharmacy asked to dose Warfarin for VTE prophylaxis.  Concurrent Heparin 5000 units SQ q8 from 4/9  PTA on Plavix and ASA 81mg  daily; meds resumed  Increased risk of bleed with anti-platelet + SQ Heparin + Warfarin.  Anticipate d/c of Heparin with therapeutic INR  INR 1.37 after 2 x 7.5mg   Warfarin  Goal of Therapy:  INR 2-3   Plan:  Warfarin 7.5mg  @ 1800 Daily PT/INR Warfarin education prior to discharge.  Otho Bellows PharmD Pager [phone] 03/12/2013, 10:12 AM

## 2013-03-13 LAB — PROTIME-INR: Prothrombin Time: 20.9 seconds — ABNORMAL HIGH (ref 11.6–15.2)

## 2013-03-13 MED ORDER — AMOXICILLIN-POT CLAVULANATE 500-125 MG PO TABS
1.0000 | ORAL_TABLET | Freq: Three times a day (TID) | ORAL | Status: DC
  Administered 2013-03-13 – 2013-03-14 (×4): 500 mg via ORAL
  Filled 2013-03-13 (×6): qty 1

## 2013-03-13 MED ORDER — SACCHAROMYCES BOULARDII 250 MG PO CAPS
250.0000 mg | ORAL_CAPSULE | Freq: Two times a day (BID) | ORAL | Status: DC
  Administered 2013-03-13 – 2013-03-14 (×3): 250 mg via ORAL
  Filled 2013-03-13 (×4): qty 1

## 2013-03-13 MED ORDER — DOXYCYCLINE HYCLATE 100 MG PO TABS
100.0000 mg | ORAL_TABLET | Freq: Two times a day (BID) | ORAL | Status: DC
  Administered 2013-03-13 – 2013-03-14 (×3): 100 mg via ORAL
  Filled 2013-03-13 (×4): qty 1

## 2013-03-13 MED ORDER — GLIPIZIDE 5 MG PO TABS
5.0000 mg | ORAL_TABLET | Freq: Two times a day (BID) | ORAL | Status: DC
  Administered 2013-03-13 – 2013-03-14 (×2): 5 mg via ORAL
  Filled 2013-03-13 (×4): qty 1

## 2013-03-13 MED ORDER — WARFARIN SODIUM 5 MG PO TABS
5.0000 mg | ORAL_TABLET | Freq: Once | ORAL | Status: AC
  Administered 2013-03-13: 5 mg via ORAL
  Filled 2013-03-13: qty 1

## 2013-03-13 MED ORDER — PANTOPRAZOLE SODIUM 40 MG PO TBEC
40.0000 mg | DELAYED_RELEASE_TABLET | Freq: Every day | ORAL | Status: DC
  Administered 2013-03-13 – 2013-03-14 (×2): 40 mg via ORAL
  Filled 2013-03-13 (×2): qty 1

## 2013-03-13 MED FILL — Ropivacaine HCl Inj 5 MG/ML: INTRAMUSCULAR | Qty: 30 | Status: AC

## 2013-03-13 NOTE — Progress Notes (Signed)
CSW assisting with d/c planning. [facility] has a SNF bed available tomorrow if pt is stable for d/c.   Cori Razor LCSW [phone]

## 2013-03-13 NOTE — Progress Notes (Signed)
Physical Therapy Treatment Patient Details Name: Craig Woodard MRN: [id number] DOB: Jul 14, 1941 Today's Date: 03/13/2013 Time: 4098-1191 PT Time Calculation (min): 10 min  PT Assessment / Plan / Recommendation Comments on Treatment Session  Pt. continues to apply weight through R foot with attempts to walk, Stickty note left for Dr. Lajoyce Corners about possibility of using a [employer] show wheic pt has, plans for SNF soon.    Follow Up Recommendations  Supervision/Assistance - 24 hour;SNF     Does the patient have the potential to tolerate intense rehabilitation     Barriers to Discharge        Equipment Recommendations  Wheelchair (measurements PT);Wheelchair cushion (measurements PT)    Recommendations for Other Services    Frequency Min 3X/week   Plan      Precautions / Restrictions Precautions Precautions: Fall Restrictions Weight Bearing Restrictions: Yes RLE Weight Bearing: Non weight bearing   Pertinent Vitals/Pain     Mobility  Transfers Sit to Stand: 4: Min assist Stand to Sit: 4: Min guard Details for Transfer Assistance: verbal cues for technique, shoe for R foot donned in case of WB,  cues to push with UE's from recliner,pt with poor ability to maintain NWB status so only tattempted small hops. Pt. would put R toes down of pressure on forefoot. Did not attempt further. Ambulation/Gait Ambulation/Gait Assistance: Not tested (comment)    Exercises General Exercises - Lower Extremity Long Arc Quad: AROM;Left;Seated Hip Flexion/Marching: AROM;Left;Seated   PT Diagnosis:    PT Problem List:   PT Treatment Interventions:     PT Goals Acute Rehab PT Goals Pt will go Sit to Stand: with supervision PT Goal: Sit to Stand - Progress: Updated due to goal met Pt will go Stand to Sit: with modified independence PT Goal: Stand to Sit - Progress: Updated due to goals met  Visit Information  Last PT Received On: 03/13/13 Assistance Needed: +2    Subjective Data  Subjective: I just cannot hop. I am really weak.   Cognition  Cognition Overall Cognitive Status: Appears within functional limits for tasks assessed/performed    Balance     End of Session PT - End of Session Equipment Utilized During Treatment: Gait belt Activity Tolerance: Patient limited by fatigue Patient left: in chair;with call bell/phone within reach Nurse Communication: Mobility status   GP     [name] 03/13/2013, 11:06 AM Blanchard Kelch PT [phone]

## 2013-03-13 NOTE — Progress Notes (Signed)
Patient ID: Craig Woodard, male   DOB: [birth year], 72 y.o.   MRN: [id number] Okay for discharge to skilled nursing. I will followup with the patient in my office in one week. Patient to keep dressing clean dry and intact. Touchdown weightbearing on the right lower extremity. Patient to wear the PRAFO 24 hours a day on the right foot.

## 2013-03-13 NOTE — Progress Notes (Signed)
Had spoken with Dr. Lajoyce Corners via phone this am & states will come & see pt this pm. Will write recommendations for R foot care (surgical area) when see pt this pm.

## 2013-03-13 NOTE — Progress Notes (Signed)
ANTICOAGULATION CONSULT NOTE - Follow Up Consult  Pharmacy Consult for Warfarin Indication: VTE prophylaxis  No Known Allergies  Patient Measurements: Height: 6' (182.9 cm) Weight: 202 lb 9.6 oz (91.899 kg) IBW/kg (Calculated) : 77.6  Vital Signs: Temp: 99 F (37.2 C) (04/14 0606) Temp src: Oral (04/14 0606) BP: 159/66 mmHg (04/14 0606) Pulse Rate: 81 (04/14 0606)  Labs:  Recent Labs  03/11/13 0530 03/12/13 0706 03/13/13 0416  HGB  --  8.5*  --   HCT  --  25.1*  --   PLT  --  216  --   LABPROT 15.6* 16.5* 20.9*  INR 1.27 1.37 1.88*  CREATININE  --  0.87  --    Estimated Creatinine Clearance: 88 ml/min (by C-G formula based on Cr of 0.87).  Medications:  Scheduled:  . amLODipine  10 mg Oral Daily  . amoxicillin-clavulanate  1 tablet Oral TID  . atorvastatin  20 mg Oral q1800  . benazepril  40 mg Oral Daily  . carvedilol  50 mg Oral BID WC  . clopidogrel  75 mg Oral Daily  . doxazosin  8 mg Oral QHS  . doxycycline  100 mg Oral Q12H  . glipiZIDE  5 mg Oral BID AC  . heparin  5,000 Units Subcutaneous Q8H  . insulin aspart  0-15 Units Subcutaneous TID WC  . insulin detemir  30 Units Subcutaneous QHS  . leflunomide  20 mg Oral Daily  . pantoprazole  40 mg Oral Daily  . polyethylene glycol  17 g Oral BID  . [COMPLETED] potassium chloride  40 mEq Oral Once  . saccharomyces boulardii  250 mg Oral BID  . senna-docusate  1 tablet Oral Daily  . [COMPLETED] warfarin  7.5 mg Oral ONCE-1800  . Warfarin - Pharmacist Dosing Inpatient   Does not apply q1800  . [DISCONTINUED] piperacillin-tazobactam (ZOSYN)  IV  3.375 g Intravenous Q8H  . [DISCONTINUED] vancomycin  1,000 mg Intravenous Q12H   Assessment: 72 yo M admitted 03/08/13 with [facility]. Amputation of R 4th & 5th rays, calcaneous excision with skin graft on 4/11. Hx of PVD with stents x 2, insulin dep DM.  Pharmacy asked to dose Warfarin for VTE prophylaxis  Concurrent Heparin 5000  units SQ q8 from 4/9.  Anticipate d/c of Heparin with therapeutic INR (MD).  PTA on Plavix and ASA 81mg  daily; Plavix resumed (ASA d/c on 4/12)  Noted DDI:  Doxycycline and Augmentin scheduled to begin 4/14 (IV abx d/c) which may prolong INR.  Noted increase bleeding risk with Plavix and Heparin.  INR increasing, but remains subtherapeutic (1.88) after 3 x 7.5mg  Warfarin  Last CBC 4/13 showed decrease Hgb, no bleeding noted.  Will f/u Hgb 4/15.  Goal of Therapy:  INR 2-3   Plan:   Decrease Warfarin dose to 5 mg @ 1800   Daily PT/INR  Warfarin education prior to discharge.  Beatrix Shipper PharmD Candidate 03/13/2013 11:15 AM  Lynann Beaver PharmD, BCPS Pager [phone] 03/13/2013 11:25 AM

## 2013-03-13 NOTE — Progress Notes (Addendum)
[facility] PROGRESS NOTE  Craig Woodard [id number]:[id number] DOB: 07/05/1941 DOA: 03/08/2013 PCP: Redmond Baseman, MD  Assessment/Plan: 1. R toe Gangrene with surrounding mild cellulitis changes, partial amputation by Dr.PArker at wound center 4/9 - s/p IV Vanc/Zosyn for 6 days, change to PO Augmentin and Doxycycline today for 7 more days - Wound cultures from OR: Polymicrobial - Greatly appreciate Dr.DUda's assistance s/p amputation of 4/5th toes and skin graft to R heel wound 4/11 - On Po coumadin per Dr.Duda for VTE prophylaxis,  - wound care per Ortho   2. DM: stable -continue Levemir increase dose , SSI -Hbaic 8.6 -resume glipizide  3. H/o RA on Leflunomide  4. HTN: continue benazepril and amlodipine  5. PVD s/p PCI -- h/o PCI and stenting of R leg vasculature by Dr.Payvar at [facility],  -continue plavix  6. Constipation -improved -miralax and senokot  DVT proph: on coumadin, stop heparin SQ once INR therapeutic  Code Status: FUll  Family Communication: none at bedside Disposition Plan: SNF tomorrow if OK with Ortho  Procedure: 4/11 Amputation right foot fourth and fifth rays.  Partial calcaneal excision.  Application of allograft skin graft right heel 2 x 3" graft.  Local tissue rearrangement for wound closure right heel wound 3 x 4 cm   Consultants:  VVS Dr.Early  Dr.Duda, ORthopedics  Antibiotics:  Vanc 4/9  Zosyn 4/9  HPI/Subjective: Anxious to see Dr.Duda, otherwise doing ok  Objective: Filed Vitals:   03/12/13 0857 03/12/13 1500 03/12/13 2100 03/13/13 0606  BP: 139/62 131/70 119/53 159/66  Pulse:  74 72 81  Temp:  99 F (37.2 C) 98.4 F (36.9 C) 99 F (37.2 C)  TempSrc:  Oral Oral Oral  Resp:  16 18 18   Height:      Weight:      SpO2:  97% 96% 100%    Intake/Output Summary (Last 24 hours) at 03/13/13 1007 Last data filed at 03/13/13 0956  Gross per 24 hour  Intake   2145 ml  Output   2300 ml  Net   -155 ml   Filed Weights    03/08/13 1537  Weight: 91.899 kg (202 lb 9.6 oz)    Exam: General: NAD  Eyes: PERRL, EOMI  ENT: moist oropharynx  Neck: supple, no JVD  Cardiovascular: RRR without MRG  Respiratory: CTA biL, good air movement without wheezing, rhonchi or crackled  Abdomen: soft, non tender to palpation, positive bowel sounds, no guarding, no rebound  Skin: no rashes  Musculoskeletal: no peripheral edema. Dry gangrene of proximal Right fifth toe with majority of toe missing, bone exposed, cellulitic changes are present surrounding the amputation site Large 2x2 cm heel ulcer, 1cm deep with some purulence Psychiatric: normal mood and affect  Neurologic: CN 2-12 grossly intact, MS 5/5 in all 4    Data Reviewed: Basic Metabolic Panel:  Recent Labs Lab 03/08/13 1304 03/10/13 0415 03/12/13 0706  NA 140 137 136  K 3.7 3.4* 3.0*  CL 101 99 98  CO2  --  28 27  GLUCOSE 282* 203* 181*  BUN 11 13 9   CREATININE 0.90 0.95 0.87  CALCIUM  --  9.1 8.5   Liver Function Tests: No results found for this basename: AST, ALT, ALKPHOS, BILITOT, PROT, ALBUMIN,  in the last 168 hours No results found for this basename: LIPASE, AMYLASE,  in the last 168 hours No results found for this basename: AMMONIA,  in the last 168 hours CBC:  Recent Labs Lab 03/08/13 1240 03/08/13 1304  03/10/13 0415 03/12/13 0706  WBC 8.2  --  8.5 6.6  NEUTROABS 6.6  --   --   --   HGB 10.8* 12.2* 9.7* 8.5*  HCT 31.8* 36.0* 29.5* 25.1*  MCV 81.5  --  80.8 80.7  PLT 201  --  191 216   Cardiac Enzymes: No results found for this basename: CKTOTAL, CKMB, CKMBINDEX, TROPONINI,  in the last 168 hours BNP (last 3 results) No results found for this basename: PROBNP,  in the last 8760 hours CBG:  Recent Labs Lab 03/12/13 0708 03/12/13 1132 03/12/13 1727 03/12/13 2114 03/13/13 0713  GLUCAP 155* 136* 257* 255* 81    Recent Results (from the past 240 hour(s))  WOUND CULTURE     Status: None   Collection Time    03/08/13   1:28 PM      Result Value Range Status   Specimen Description TOE RIGHT LITTLE TOE   Final   Special Requests NONE   Final   Gram Stain     Final   Value: NO WBC SEEN     NO SQUAMOUS EPITHELIAL CELLS SEEN     RARE GRAM POSITIVE COCCI     IN PAIRS   Culture     Final   Value: MULTIPLE ORGANISMS PRESENT, NONE PREDOMINANT     Note: NO STAPHYLOCOCCUS AUREUS ISOLATED NO GROUP A STREP (S.PYOGENES) ISOLATED   Report Status 03/10/2013 FINAL   Final  SURGICAL PCR SCREEN     Status: None   Collection Time    03/10/13  6:27 AM      Result Value Range Status   MRSA, PCR NEGATIVE  NEGATIVE Final   Staphylococcus aureus NEGATIVE  NEGATIVE Final   Comment:            The Xpert SA Assay (FDA     approved for NASAL specimens     in patients over 46 years of age),     is one component of     a comprehensive surveillance     program.  Test performance has     been validated by [employer] for patients greater     than or equal to 23 year old.     It is not intended     to diagnose infection nor to     guide or monitor treatment.     Studies: No results found.  Scheduled Meds: . amLODipine  10 mg Oral Daily  . amoxicillin-clavulanate  1 tablet Oral TID  . atorvastatin  20 mg Oral q1800  . benazepril  40 mg Oral Daily  . carvedilol  50 mg Oral BID WC  . clopidogrel  75 mg Oral Daily  . doxazosin  8 mg Oral QHS  . doxycycline  100 mg Oral Q12H  . heparin  5,000 Units Subcutaneous Q8H  . insulin aspart  0-15 Units Subcutaneous TID WC  . insulin detemir  30 Units Subcutaneous QHS  . leflunomide  20 mg Oral Daily  . pantoprazole  40 mg Oral Daily  . polyethylene glycol  17 g Oral BID  . saccharomyces boulardii  250 mg Oral BID  . senna-docusate  1 tablet Oral Daily  . Warfarin - Pharmacist Dosing Inpatient   Does not apply q1800   Continuous Infusions: . sodium chloride 20 mL/hr at 03/12/13 1914    Active Problems:   Diabetes   HTN (hypertension)   Osteomyelitis   Gangrene  of toe  Cellulitis of leg, right    Time spent:    [facility]  [facility] Pager [phone]. If 7PM-7AM, please contact night-coverage at [email or web address], password [facility] 03/13/2013, 10:07 AM  LOS: 5 days

## 2013-03-14 LAB — CBC
HCT: 27.3 % — ABNORMAL LOW (ref 39.0–52.0)
MCV: 80.8 fL (ref 78.0–100.0)
RDW: 15.1 % (ref 11.5–15.5)
WBC: 5.1 10*3/uL (ref 4.0–10.5)

## 2013-03-14 LAB — BASIC METABOLIC PANEL
BUN: 7 mg/dL (ref 6–23)
Chloride: 103 mEq/L (ref 96–112)
Creatinine, Ser: 0.79 mg/dL (ref 0.50–1.35)
GFR calc Af Amer: >90 mL/min (ref >90)

## 2013-03-14 LAB — GLUCOSE, CAPILLARY: Glucose-Capillary: 80 mg/dL (ref 70–99)

## 2013-03-14 MED ORDER — POTASSIUM CHLORIDE CRYS ER 20 MEQ PO TBCR
40.0000 meq | EXTENDED_RELEASE_TABLET | Freq: Once | ORAL | Status: AC
  Administered 2013-03-14: 40 meq via ORAL
  Filled 2013-03-14: qty 2

## 2013-03-14 MED ORDER — AMOXICILLIN-POT CLAVULANATE 500-125 MG PO TABS: 1.0000 | ORAL_TABLET | Freq: Three times a day (TID) | ORAL | Status: DC

## 2013-03-14 MED ORDER — DOXYCYCLINE HYCLATE 100 MG PO CAPS: 100.0000 mg | ORAL_CAPSULE | Freq: Two times a day (BID) | ORAL | Status: DC

## 2013-03-14 MED ORDER — OXYCODONE-ACETAMINOPHEN 5-325 MG PO TABS: 1.0000 | ORAL_TABLET | Freq: Four times a day (QID) | ORAL | Status: DC | PRN

## 2013-03-14 MED ORDER — WARFARIN SODIUM 5 MG PO TABS: 5.0000 mg | ORAL_TABLET | Freq: Every day | ORAL | Status: DC

## 2013-03-14 MED ORDER — SENNOSIDES-DOCUSATE SODIUM 8.6-50 MG PO TABS: 1.0000 | ORAL_TABLET | Freq: Two times a day (BID) | ORAL | Status: DC | PRN

## 2013-03-14 MED ORDER — SACCHAROMYCES BOULARDII 250 MG PO CAPS: 250.0000 mg | ORAL_CAPSULE | Freq: Two times a day (BID) | ORAL | Status: DC

## 2013-03-14 MED ORDER — PANTOPRAZOLE SODIUM 40 MG PO TBEC: 40.0000 mg | DELAYED_RELEASE_TABLET | Freq: Every day | ORAL | Status: AC

## 2013-03-14 NOTE — Progress Notes (Signed)
Pt for d/c to [facility] today. IV d/c'd. Dressing CDI to R foot with ace wrap. Report given to receiving RN to Rehab center. Prafo boot on at all times & encourage to elevate RLE. Family to transport pt to facility. Pain med given prior to d/c.

## 2013-03-14 NOTE — Discharge Summary (Signed)
Physician Discharge Summary  Craig Woodard [id number] DOB: Feb 18, 1941 DOA: 03/08/2013  PCP: Redmond Baseman, MD  Admit date: 03/08/2013 Discharge date: 03/14/2013  Time spent:50 minutes  Recommendations for Outpatient Follow-up:  Dr.Duda in 1 week PCP in 1 week INR check 4/16 Stop Coumadin after 1 month   Discharge Diagnoses:  Gangrene right little toe and right fourth toe.  Osteomyelitis right calcaneus. Chronic R heel ulcer  Diabetes  HTN (hypertension)  Osteomyelitis  Rheumatoid arthritis  h/o PVD s/p PCI     Discharge Condition: stable  Diet recommendation: Low sodium, carb modified  Filed Weights   03/08/13 1537  Weight: 91.899 kg (202 lb 9.6 oz)    Procedure: 4/11 by Dr.Duda Amputation right foot fourth and fifth rays.  Partial calcaneal excision.  Application of allograft skin graft right heel 2 x 3" graft.  Local tissue rearrangement for wound closure right heel wound 3 x 4 cm   Consultants:  VVS Dr.Early  Dr.Duda, ORthopedic   History of present illness:  Craig Woodard is a 72 y.o. male has a past medical history significant for insulin-dependent diabetes, severe peripheral vascular disease status post 2 stents, presented to the emergency room after being seen today in the wound care clinic (Dr. Jimmey Ralph) where he had his right fifth toe amputated. Over the last couple weeks his toe starting getting black. He denies his right foot getting cold, and he endorses much better blood flow in the right foot also stents. Dr. Jimmey Ralph appreciated that the toe was gangrenous, and was partially amputated, and patient was sent here for IV antibiotics and orthopedics consultation   Hospital Course:  1. R toe Gangrene with surrounding mild cellulitis changes, s/p partial amputation by Dr.PArker at wound center on 4/9 - He was initially treated with IV Vanc/Zosyn for 6 days, then changed to PO Augmentin and Doxycycline 4/14 for  7 more days  - Wound cultures from  OR: Polymicrobial  - Was seen by Dr.Duda in consultation, underwent amputation of 4/5th toes, Partial calcaneal excision and skin graft to R heel wound on 4/11  - Started On Po coumadin per Dr.Duda for VTE prophylaxis, will need this for 1 month - wound care per Ortho as follows "Patient to keep dressing clean dry and intact. Touchdown weightbearing on the right lower extremity. Patient to wear the PRAFO 24 hours a day on the right foot" - FU with Dr.Duda in 1 week  2. DM: stable  -continued on Levemir and SSI  -Hbaic 8.6  -resume glipizide and metformin  3. H/o RA on Leflunomide   4. HTN: continue benazepril and amlodipine   5. PVD s/p PCI  - h/o PCI and stenting of R leg vasculature by Dr[facility]Payvar at [facility],  -continue plavix   6. Constipation  -improved with miralax and senokot    Disposition Plan: SNF today     Discharge Exam: Filed Vitals:   03/13/13 0606 03/13/13 1350 03/13/13 2126 03/14/13 0655  BP: 159/66 136/76 126/69 153/67  Pulse: 81 78 73 77  Temp: 99 F (37.2 C) 98.6 F (37 C) 98.1 F (36.7 C) 97.9 F (36.6 C)  TempSrc: Oral Oral Oral Oral  Resp: 18 16 16 16   Height:      Weight:      SpO2: 100% 97% 94% 97%    General: AAOx3 Cardiovascular:S1S2/RRR Respiratory: CTAB  Discharge Instructions  Discharge Orders   Future Appointments Provider Department Dept Phone   05/01/2013 9:00 AM Ileana Ladd, MD [facility]  FAMILY MEDICINE [phone]   Future Orders Complete By Expires     Diet - low sodium heart healthy  As directed     Diet Carb Modified  As directed     Discharge instructions  As directed     Comments:      Patient to keep dressing clean dry and intact. Touchdown weightbearing on the right lower extremity. Patient to wear the PRAFO 24 hours a day on the right foot    Increase activity slowly  As directed         Medication List    STOP taking these medications       aspirin 81 MG tablet      TAKE these medications        amLODipine 10 MG tablet  Commonly known as:  NORVASC  Take 10 mg by mouth daily.     amoxicillin-clavulanate 500-125 MG per tablet  Commonly known as:  AUGMENTIN  Take 1 tablet (500 mg total) by mouth 3 (three) times daily. For 6 days     benazepril 40 MG tablet  Commonly known as:  LOTENSIN  Take 40 mg by mouth daily.     carvedilol 25 MG tablet  Commonly known as:  COREG  Take 50 mg by mouth 2 (two) times daily with a meal.     clopidogrel 75 MG tablet  Commonly known as:  PLAVIX  Take 75 mg by mouth daily.     doxazosin 8 MG tablet  Commonly known as:  CARDURA  Take 8 mg by mouth at bedtime.     doxycycline 100 MG capsule  Commonly known as:  VIBRAMYCIN  Take 1 capsule (100 mg total) by mouth 2 (two) times daily. For 6 days     glipiZIDE 10 MG tablet  Commonly known as:  GLUCOTROL  Take 10 mg by mouth 2 (two) times daily before a meal.     insulin detemir 100 UNIT/ML injection  Commonly known as:  LEVEMIR  Inject 25 Units into the skin at bedtime.     leflunomide 20 MG tablet  Commonly known as:  ARAVA  Take 20 mg by mouth daily.     LIPITOR PO  Take 1 tablet by mouth daily. Pt doesn't know the dosage of medication. [facility]     metFORMIN 1000 MG tablet  Commonly known as:  GLUCOPHAGE  Take 1,000 mg by mouth 2 (two) times daily with a meal.     multivitamin with minerals tablet  Take 1 tablet by mouth daily.     oxyCODONE-acetaminophen 5-325 MG per tablet  Commonly known as:  PERCOCET/ROXICET  Take 1-2 tablets by mouth every 6 (six) hours as needed for pain.     pantoprazole 40 MG tablet  Commonly known as:  PROTONIX  Take 1 tablet (40 mg total) by mouth daily.     pioglitazone 45 MG tablet  Commonly known as:  ACTOS  Take 45 mg by mouth every other day.     saccharomyces boulardii 250 MG capsule  Commonly known as:  FLORASTOR  Take 1 capsule (250 mg total) by mouth 2 (two) times daily. For 7 days      senna-docusate 8.6-50 MG per tablet  Commonly known as:  Senokot-S  Take 1 tablet by mouth 2 (two) times daily as needed for constipation.     traMADol 50 MG tablet  Commonly known as:  ULTRAM  Take 50 mg by mouth  every 6 (six) hours as needed for pain (pain).     warfarin 5 MG tablet  Commonly known as:  COUMADIN  Take 1 tablet (5 mg total) by mouth daily. 5 mg on 4/15 and 4/16 and then based on INR 4/17  Goal INR 2-3    For 1 month           Follow-up Information   Follow up with DUDA,MARCUS V, MD In 1 week.   Contact information:   [address] [place] [address] [phone]       Follow up with Redmond Baseman, MD In 1 week.   Contact information:   [address] [place] [address] [phone]        The results of significant diagnostics from this hospitalization (including imaging, microbiology, ancillary and laboratory) are listed below for reference.    Significant Diagnostic Studies: Dg Foot Complete Right  03/15/13  *RADIOLOGY REPORT*  Clinical Data: Fifth toe amputation.  Heel ulcer.  RIGHT FOOT COMPLETE - 3+ VIEW  Comparison: Os calcis 01/20/1939.  Findings: There is partial amputation of the fifth digit at the proximal interphalangeal joint.  Nondisplaced lucency is seen in the lateral head of the fifth proximal phalanx, with adjacent osseous fragments.  Degenerative changes are seen at the first metatarsal phalangeal joint with hallux valgus.  Scattered erosive changes are seen in the heads of the metatarsals.  Soft tissue ulceration overlies the dorsal calcaneus without underlying osseous erosion.  IMPRESSION:  1.  Soft tissue ulceration over the calcaneus without evidence of underlying osteomyelitis. 2.  Postoperative changes of partial amputation of the fifth digit. Lucency involving the head of the fifth proximal phalanx is suspicious for fracture.  Please correlate clinically. 3.  First metatarsal phalangeal joint osteoarthritis.  4.  Erosive changes in the heads of the metatarsals.   Original Report Authenticated By: Leanna Battles, M.D.    Dg Foot Complete Right  03/07/2013  *RADIOLOGY REPORT*  Clinical Data: Wound on right fifth toe.  Rule out osteomyelitis.  RIGHT FOOT COMPLETE - 3+ VIEW  Comparison: None.  Findings: Dense vascular calcifications.  A bandage over the midfoot.  Probable ulcer about the heel on the lateral view.  No underlying osseous destruction. No radio-opaque foreign body.  There is a moderate amount of soft tissue gas about the fifth phalanx.  Poorly evaluated on the lateral.  No gross osseous destruction identified.  There is osseous irregularity about the distal fifth and fourth metatarsal heads medially.  Similar finding about the medial first metatarsal head with underlying first MTP joint osteoarthritis.  IMPRESSION: Moderate soft tissue gas about the fifth phalanx, without gross osseous destruction.  This could represent underlying soft tissue defect, or even a gas producing infection.  Please note that if there is an ongoing concern of osteomyelitis, pre and post contrast MRI is recommended.  Lucencies about the first, fourth, and fifth distal metatarsals are suspicious for arthropathy such as gouty arthritis.  Clinically correlate.  Probable ulcer about the heel, without underlying osseous destruction.   Original Report Authenticated By: Jeronimo Greaves, M.D.     Microbiology: Recent Results (from the past 240 hour(s))  WOUND CULTURE     Status: None   Collection Time    03/15/2013  1:28 PM      Result Value Range Status   Specimen Description TOE RIGHT LITTLE TOE   Final   Special Requests NONE   Final   Gram Stain  Final   Value: NO WBC SEEN     NO SQUAMOUS EPITHELIAL CELLS SEEN     RARE GRAM POSITIVE COCCI     IN PAIRS   Culture     Final   Value: MULTIPLE ORGANISMS PRESENT, NONE PREDOMINANT     Note: NO STAPHYLOCOCCUS AUREUS ISOLATED NO GROUP A STREP (S.PYOGENES) ISOLATED   Report Status  03/10/2013 FINAL   Final  SURGICAL PCR SCREEN     Status: None   Collection Time    03/10/13  6:27 AM      Result Value Range Status   MRSA, PCR NEGATIVE  NEGATIVE Final   Staphylococcus aureus NEGATIVE  NEGATIVE Final   Comment:            The Xpert SA Assay (FDA     approved for NASAL specimens     in patients over 61 years of age),     is one component of     a comprehensive surveillance     program.  Test performance has     been validated by [employer] for patients greater     than or equal to 16 year old.     It is not intended     to diagnose infection nor to     guide or monitor treatment.     Labs: Basic Metabolic Panel:  Recent Labs Lab 03/08/13 1304 03/10/13 0415 03/12/13 0706 03/14/13 0445  NA 140 137 136 142  K 3.7 3.4* 3.0* 3.0*  CL 101 99 98 103  CO2  --  28 27 29   GLUCOSE 282* 203* 181* 68*  BUN 11 13 9 7   CREATININE 0.90 0.95 0.87 0.79  CALCIUM  --  9.1 8.5 8.8   Liver Function Tests: No results found for this basename: AST, ALT, ALKPHOS, BILITOT, PROT, ALBUMIN,  in the last 168 hours No results found for this basename: LIPASE, AMYLASE,  in the last 168 hours No results found for this basename: AMMONIA,  in the last 168 hours CBC:  Recent Labs Lab 03/08/13 1240 03/08/13 1304 03/10/13 0415 03/12/13 0706 03/14/13 0445  WBC 8.2  --  8.5 6.6 5.1  NEUTROABS 6.6  --   --   --   --   HGB 10.8* 12.2* 9.7* 8.5* 9.1*  HCT 31.8* 36.0* 29.5* 25.1* 27.3*  MCV 81.5  --  80.8 80.7 80.8  PLT 201  --  191 216 238   Cardiac Enzymes: No results found for this basename: CKTOTAL, CKMB, CKMBINDEX, TROPONINI,  in the last 168 hours BNP: BNP (last 3 results) No results found for this basename: PROBNP,  in the last 8760 hours CBG:  Recent Labs Lab 03/13/13 0713 03/13/13 1135 03/13/13 1704 03/13/13 2108 03/14/13 0727  GLUCAP 81 123* 132* 159* 80       Signed:  Cyenna Rebello  [facility] 03/14/2013, 8:59 AM

## 2013-03-14 NOTE — Progress Notes (Signed)
Clinical Social Work Department CLINICAL SOCIAL WORK PLACEMENT NOTE 03/14/2013  Patient:  Craig Woodard, Craig Woodard  Account Number:  [id number] Admit date:  03/08/2013  Clinical Social Worker:  Doroteo Glassman  Date/time:  03/11/2013 03:36 PM  Clinical Social Work is seeking post-discharge placement for this patient at the following level of care:   SKILLED NURSING   (*CSW will update this form in Epic as items are completed)   03/11/2013  Patient/family provided with [facility] Department of Clinical Social Work's list of facilities offering this level of care within the geographic area requested by the patient (or if unable, by the patient's family).  03/11/2013  Patient/family informed of their freedom to choose among providers that offer the needed level of care, that participate in Medicare, Medicaid or managed care program needed by the patient, have an available bed and are willing to accept the patient.  03/11/2013  Patient/family informed of [facility]' ownership interest in [facility], as well as of the fact that they are under no obligation to receive care at this facility.  PASARR submitted to EDS on 03/11/2013 PASARR number received from EDS on 03/11/2013  FL2 transmitted to all facilities in geographic area requested by pt/family on  03/11/2013 FL2 transmitted to all facilities within larger geographic area on   Patient informed that his/her managed care company has contracts with or will negotiate with  certain facilities, including the following:     Patient/family informed of bed offers received:  03/13/2013 Patient chooses bed at [facility] Physician recommends and patient chooses bed at    Patient to be transferred to [facility] on  03/14/2013 Patient to be transferred to facility by FAMILY  The following physician request were entered in Epic:   Additional Comments:  Cori Razor LCSW [phone]

## 2013-03-14 NOTE — Progress Notes (Signed)
ANTICOAGULATION CONSULT NOTE - Follow Up Consult  Pharmacy Consult for Warfarin Indication: VTE prophylaxis  No Known Allergies  Patient Measurements: Height: 6' (182.9 cm) Weight: 202 lb 9.6 oz (91.899 kg) IBW/kg (Calculated) : 77.6  Vital Signs: Temp: 97.9 F (36.6 C) (04/15 0655) Temp src: Oral (04/15 0655) BP: 153/67 mmHg (04/15 0655) Pulse Rate: 77 (04/15 0655)  Labs:  Recent Labs  03/12/13 0706 03/13/13 0416 03/14/13 0445  HGB 8.5*  --  9.1*  HCT 25.1*  --  27.3*  PLT 216  --  238  LABPROT 16.5* 20.9* 21.7*  INR 1.37 1.88* 1.98*  CREATININE 0.87  --  0.79   Estimated Creatinine Clearance: 95.7 ml/min (by C-G formula based on Cr of 0.79).  Medications:  Scheduled:  . amLODipine  10 mg Oral Daily  . amoxicillin-clavulanate  1 tablet Oral TID  . atorvastatin  20 mg Oral q1800  . benazepril  40 mg Oral Daily  . carvedilol  50 mg Oral BID WC  . clopidogrel  75 mg Oral Daily  . doxazosin  8 mg Oral QHS  . doxycycline  100 mg Oral Q12H  . glipiZIDE  5 mg Oral BID AC  . insulin aspart  0-15 Units Subcutaneous TID WC  . insulin detemir  30 Units Subcutaneous QHS  . leflunomide  20 mg Oral Daily  . pantoprazole  40 mg Oral Daily  . polyethylene glycol  17 g Oral BID  . [COMPLETED] potassium chloride  40 mEq Oral Once  . saccharomyces boulardii  250 mg Oral BID  . senna-docusate  1 tablet Oral Daily  . [COMPLETED] warfarin  5 mg Oral ONCE-1800  . Warfarin - Pharmacist Dosing Inpatient   Does not apply q1800  . [DISCONTINUED] heparin  5,000 Units Subcutaneous Q8H   Assessment: 72 yo M admitted 03/08/13 with [facility]. Amputation of R 4th & 5th rays, calcaneous excision with skin graft on 4/11. Hx of PVD with stents x 2, insulin dep DM.  Pharmacy asked to dose Warfarin for VTE prophylaxis  Concurrent Heparin 5000 units SQ q8 from 4/9.  Anticipate d/c of Heparin with therapeutic INR (MD).  PTA on Plavix and ASA 81mg  daily; Plavix  resumed (ASA d/c on 4/12)  Noted DDI:  Doxycycline and Augmentin may prolong INR.  Noted increase bleeding risk with Plavix and Heparin.  INR increasing, just barely subtherapeutic (1.98)  Last CBC 4/13 showed decrease Hgb, but is increased on 4/15.  No bleeding noted.  Goal of Therapy:  INR 2-3   Plan:   Warfarin 5 mg @ 1800 if pt is still in the hospital  Daily PT/INR  Warfarin education prior to discharge.   Lynann Beaver PharmD, BCPS Pager [phone] 03/14/2013 11:06 AM

## 2013-03-15 ENCOUNTER — Encounter (HOSPITAL_COMMUNITY): Payer: Self-pay | Admitting: Orthopedic Surgery

## 2013-05-01 ENCOUNTER — Ambulatory Visit: Payer: Self-pay | Admitting: Family Medicine

## 2013-05-11 ENCOUNTER — Other Ambulatory Visit (HOSPITAL_COMMUNITY): Payer: Self-pay | Admitting: Orthopedic Surgery

## 2013-05-11 ENCOUNTER — Encounter (HOSPITAL_COMMUNITY): Payer: Self-pay | Admitting: Pharmacy Technician

## 2013-05-16 ENCOUNTER — Encounter (HOSPITAL_COMMUNITY): Payer: Self-pay | Admitting: *Deleted

## 2013-05-16 MED ORDER — CEFAZOLIN SODIUM-DEXTROSE 2-3 GM-% IV SOLR
2.0000 g | INTRAVENOUS | Status: AC
  Administered 2013-05-17: 2 g via INTRAVENOUS
  Filled 2013-05-16: qty 50

## 2013-05-16 NOTE — Progress Notes (Signed)
I received a call from Amesti, pts nurse at [facility].  Pt is on Coumadin and INR is 3.2 today.  I instructed Amand to call Dr Lajoyce Corners. I notified Edmonia Caprio, PA.

## 2013-05-16 NOTE — Progress Notes (Signed)
05/16/13 1114  OBSTRUCTIVE SLEEP APNEA  Have you ever been diagnosed with sleep apnea through a sleep study? No  Do you snore loudly (loud enough to be heard through closed doors)?  1  Has anyone observed you stop breathing during your sleep? 1  Do you have, or are you being treated for high blood pressure? 1  BMI more than 35 kg/m2? 0  Age over 70 years old? 1  Gender: 1  Obstructive Sleep Apnea Score 5  Score 4 or greater  Results sent to PCP

## 2013-05-17 ENCOUNTER — Inpatient Hospital Stay (HOSPITAL_COMMUNITY): Payer: Medicare Other | Admitting: Certified Registered"

## 2013-05-17 ENCOUNTER — Encounter (HOSPITAL_COMMUNITY): Payer: Self-pay | Admitting: Certified Registered"

## 2013-05-17 ENCOUNTER — Inpatient Hospital Stay (HOSPITAL_COMMUNITY): Payer: Medicare Other

## 2013-05-17 ENCOUNTER — Inpatient Hospital Stay (HOSPITAL_COMMUNITY)
Admission: RE | Admit: 2013-05-17 | Discharge: 2013-05-19 | DRG: 240 | Disposition: A | Discharge status: Skilled Nursing Facility | Payer: Medicare Other | Source: Ambulatory Visit | Attending: Orthopedic Surgery | Admitting: Orthopedic Surgery

## 2013-05-17 ENCOUNTER — Encounter (HOSPITAL_COMMUNITY)
Admission: RE | Disposition: A | Discharge status: Skilled Nursing Facility | Payer: Self-pay | Source: Ambulatory Visit | Attending: Orthopedic Surgery

## 2013-05-17 ENCOUNTER — Encounter (HOSPITAL_COMMUNITY): Payer: Self-pay | Admitting: *Deleted

## 2013-05-17 DIAGNOSIS — L97809 Non-pressure chronic ulcer of other part of unspecified lower leg with unspecified severity: Secondary | ICD-10-CM | POA: Diagnosis present

## 2013-05-17 DIAGNOSIS — E119 Type 2 diabetes mellitus without complications: Secondary | ICD-10-CM

## 2013-05-17 DIAGNOSIS — E1169 Type 2 diabetes mellitus with other specified complication: Secondary | ICD-10-CM | POA: Diagnosis present

## 2013-05-17 DIAGNOSIS — M869 Osteomyelitis, unspecified: Secondary | ICD-10-CM

## 2013-05-17 DIAGNOSIS — I96 Gangrene, not elsewhere classified: Secondary | ICD-10-CM | POA: Diagnosis present

## 2013-05-17 DIAGNOSIS — M9080 Osteopathy in diseases classified elsewhere, unspecified site: Secondary | ICD-10-CM | POA: Diagnosis present

## 2013-05-17 DIAGNOSIS — Z96659 Presence of unspecified artificial knee joint: Secondary | ICD-10-CM

## 2013-05-17 DIAGNOSIS — E1159 Type 2 diabetes mellitus with other circulatory complications: Principal | ICD-10-CM | POA: Diagnosis present

## 2013-05-17 DIAGNOSIS — Z87891 Personal history of nicotine dependence: Secondary | ICD-10-CM

## 2013-05-17 HISTORY — PX: BELOW KNEE LEG AMPUTATION: SUR23

## 2013-05-17 HISTORY — DX: Personal history of Methicillin resistant Staphylococcus aureus infection: Z86.14

## 2013-05-17 HISTORY — DX: Peripheral vascular disease, unspecified: I73.9

## 2013-05-17 HISTORY — PX: AMPUTATION: SHX166

## 2013-05-17 LAB — COMPREHENSIVE METABOLIC PANEL
Alkaline Phosphatase: 92 U/L (ref 39–117)
BUN: 13 mg/dL (ref 6–23)
CO2: 26 mEq/L (ref 19–32)
Chloride: 99 mEq/L (ref 96–112)
Creatinine, Ser: 0.89 mg/dL (ref 0.50–1.35)
GFR calc non Af Amer: 85 mL/min — ABNORMAL LOW (ref >90)
Total Bilirubin: 0.4 mg/dL (ref 0.3–1.2)

## 2013-05-17 LAB — GLUCOSE, CAPILLARY: Glucose-Capillary: 259 mg/dL — ABNORMAL HIGH (ref 70–99)

## 2013-05-17 LAB — PROTIME-INR
INR: 2.44 — ABNORMAL HIGH (ref 0.00–1.49)
Prothrombin Time: 25.4 seconds — ABNORMAL HIGH (ref 11.6–15.2)

## 2013-05-17 LAB — CBC
HCT: 36.8 % — ABNORMAL LOW (ref 39.0–52.0)
Hemoglobin: 12.3 g/dL — ABNORMAL LOW (ref 13.0–17.0)
MCV: 79.3 fL (ref 78.0–100.0)
RBC: 4.64 MIL/uL (ref 4.22–5.81)
WBC: 6.3 10*3/uL (ref 4.0–10.5)

## 2013-05-17 SURGERY — AMPUTATION BELOW KNEE
Anesthesia: General | Site: Leg Lower | Laterality: Right | Wound class: Clean

## 2013-05-17 MED ORDER — HYDROMORPHONE HCL PF 1 MG/ML IJ SOLN
0.2500 mg | INTRAMUSCULAR | Status: DC | PRN
  Administered 2013-05-17 (×4): 0.5 mg via INTRAVENOUS

## 2013-05-17 MED ORDER — METOCLOPRAMIDE HCL 10 MG PO TABS: 5.0000 mg | ORAL_TABLET | Freq: Three times a day (TID) | ORAL | Status: DC | PRN

## 2013-05-17 MED ORDER — TAMSULOSIN HCL 0.4 MG PO CAPS
0.4000 mg | ORAL_CAPSULE | Freq: Every day | ORAL | Status: DC
  Administered 2013-05-18 – 2013-05-19 (×2): 0.4 mg via ORAL
  Filled 2013-05-17 (×3): qty 1

## 2013-05-17 MED ORDER — MUPIROCIN 2 % EX OINT
TOPICAL_OINTMENT | CUTANEOUS | Status: AC
  Filled 2013-05-17: qty 22

## 2013-05-17 MED ORDER — METFORMIN HCL 500 MG PO TABS
1000.0000 mg | ORAL_TABLET | Freq: Two times a day (BID) | ORAL | Status: DC
  Administered 2013-05-17 – 2013-05-19 (×4): 1000 mg via ORAL
  Filled 2013-05-17 (×6): qty 2

## 2013-05-17 MED ORDER — WARFARIN - PHARMACIST DOSING INPATIENT
Freq: Every day | Status: DC
  Administered 2013-05-17: 18:00:00

## 2013-05-17 MED ORDER — CEFAZOLIN SODIUM 1-5 GM-% IV SOLN
1.0000 g | Freq: Four times a day (QID) | INTRAVENOUS | Status: AC
  Administered 2013-05-17 – 2013-05-18 (×3): 1 g via INTRAVENOUS
  Filled 2013-05-17 (×3): qty 50

## 2013-05-17 MED ORDER — FENTANYL CITRATE 0.05 MG/ML IJ SOLN
INTRAMUSCULAR | Status: DC | PRN
  Administered 2013-05-17: 100 ug via INTRAVENOUS
  Administered 2013-05-17: 50 ug via INTRAVENOUS
  Administered 2013-05-17: 100 ug via INTRAVENOUS

## 2013-05-17 MED ORDER — ONDANSETRON HCL 4 MG PO TABS: 4.0000 mg | ORAL_TABLET | Freq: Four times a day (QID) | ORAL | Status: DC | PRN

## 2013-05-17 MED ORDER — METFORMIN HCL 500 MG PO TABS: 1000.0000 mg | ORAL_TABLET | Freq: Two times a day (BID) | ORAL | Status: DC

## 2013-05-17 MED ORDER — LEFLUNOMIDE 20 MG PO TABS
20.0000 mg | ORAL_TABLET | Freq: Every day | ORAL | Status: DC
  Administered 2013-05-18 – 2013-05-19 (×2): 20 mg via ORAL
  Filled 2013-05-17 (×3): qty 1

## 2013-05-17 MED ORDER — OXYCODONE-ACETAMINOPHEN 5-325 MG PO TABS
ORAL_TABLET | ORAL | Status: AC
  Administered 2013-05-17: 2
  Filled 2013-05-17: qty 2

## 2013-05-17 MED ORDER — LACTATED RINGERS IV SOLN
INTRAVENOUS | Status: DC
  Administered 2013-05-17: 10:00:00 via INTRAVENOUS

## 2013-05-17 MED ORDER — 0.9 % SODIUM CHLORIDE (POUR BTL) OPTIME
TOPICAL | Status: DC | PRN
  Administered 2013-05-17: 1000 mL

## 2013-05-17 MED ORDER — HYDROMORPHONE HCL PF 1 MG/ML IJ SOLN
INTRAMUSCULAR | Status: AC
  Filled 2013-05-17: qty 1

## 2013-05-17 MED ORDER — CARVEDILOL 25 MG PO TABS
25.0000 mg | ORAL_TABLET | Freq: Two times a day (BID) | ORAL | Status: DC
  Administered 2013-05-18 – 2013-05-19 (×3): 25 mg via ORAL
  Filled 2013-05-17 (×7): qty 1

## 2013-05-17 MED ORDER — ONDANSETRON HCL 4 MG/2ML IJ SOLN
INTRAMUSCULAR | Status: DC | PRN
  Administered 2013-05-17: 4 mg via INTRAVENOUS

## 2013-05-17 MED ORDER — SODIUM CHLORIDE 0.9 % IV SOLN: INTRAVENOUS | Status: DC

## 2013-05-17 MED ORDER — PANTOPRAZOLE SODIUM 40 MG PO TBEC
40.0000 mg | DELAYED_RELEASE_TABLET | Freq: Every day | ORAL | Status: DC
  Administered 2013-05-18 – 2013-05-19 (×2): 40 mg via ORAL
  Filled 2013-05-17 (×2): qty 1

## 2013-05-17 MED ORDER — AMLODIPINE BESYLATE 10 MG PO TABS
10.0000 mg | ORAL_TABLET | Freq: Every day | ORAL | Status: DC
  Administered 2013-05-18 – 2013-05-19 (×2): 10 mg via ORAL
  Filled 2013-05-17 (×2): qty 1

## 2013-05-17 MED ORDER — HYDROMORPHONE HCL PF 1 MG/ML IJ SOLN
0.5000 mg | INTRAMUSCULAR | Status: DC | PRN
  Administered 2013-05-17 (×2): 1 mg via INTRAVENOUS
  Administered 2013-05-18: 0.5 mg via INTRAVENOUS
  Administered 2013-05-18 (×2): 1 mg via INTRAVENOUS
  Filled 2013-05-17 (×5): qty 1

## 2013-05-17 MED ORDER — ONDANSETRON 4 MG PO TBDP
4.0000 mg | ORAL_TABLET | Freq: Every morning | ORAL | Status: DC
  Administered 2013-05-19: 4 mg via ORAL
  Filled 2013-05-17 (×3): qty 1

## 2013-05-17 MED ORDER — CHLORHEXIDINE GLUCONATE CLOTH 2 % EX PADS
6.0000 | MEDICATED_PAD | Freq: Every day | CUTANEOUS | Status: DC
  Administered 2013-05-18: 6 via TOPICAL

## 2013-05-17 MED ORDER — MUPIROCIN 2 % EX OINT
TOPICAL_OINTMENT | Freq: Two times a day (BID) | CUTANEOUS | Status: DC
  Administered 2013-05-17: 1 via NASAL
  Filled 2013-05-17: qty 22

## 2013-05-17 MED ORDER — PROPOFOL 10 MG/ML IV BOLUS
INTRAVENOUS | Status: DC | PRN
  Administered 2013-05-17: 200 mg via INTRAVENOUS

## 2013-05-17 MED ORDER — OXYCODONE-ACETAMINOPHEN 5-325 MG PO TABS
1.0000 | ORAL_TABLET | ORAL | Status: DC | PRN
  Administered 2013-05-17 – 2013-05-18 (×7): 2 via ORAL
  Filled 2013-05-17 (×7): qty 2

## 2013-05-17 MED ORDER — MUPIROCIN 2 % EX OINT
1.0000 "application " | TOPICAL_OINTMENT | Freq: Two times a day (BID) | CUTANEOUS | Status: DC
  Administered 2013-05-17 – 2013-05-19 (×4): 1 via NASAL
  Filled 2013-05-17: qty 22

## 2013-05-17 MED ORDER — METOCLOPRAMIDE HCL 5 MG/ML IJ SOLN: 5.0000 mg | Freq: Three times a day (TID) | INTRAMUSCULAR | Status: DC | PRN

## 2013-05-17 MED ORDER — ONDANSETRON HCL 4 MG/2ML IJ SOLN
4.0000 mg | Freq: Four times a day (QID) | INTRAMUSCULAR | Status: DC | PRN
  Administered 2013-05-18 – 2013-05-19 (×2): 4 mg via INTRAVENOUS
  Filled 2013-05-17 (×2): qty 2

## 2013-05-17 MED ORDER — WARFARIN SODIUM 4 MG PO TABS
4.0000 mg | ORAL_TABLET | Freq: Every evening | ORAL | Status: DC
  Administered 2013-05-17 – 2013-05-18 (×2): 4 mg via ORAL
  Filled 2013-05-17 (×3): qty 1

## 2013-05-17 MED ORDER — SENNOSIDES-DOCUSATE SODIUM 8.6-50 MG PO TABS: 1.0000 | ORAL_TABLET | Freq: Every day | ORAL | Status: DC | PRN

## 2013-05-17 MED ORDER — ONDANSETRON HCL 4 MG/2ML IJ SOLN: 4.0000 mg | Freq: Once | INTRAMUSCULAR | Status: DC | PRN

## 2013-05-17 MED ORDER — LIDOCAINE HCL (CARDIAC) 20 MG/ML IV SOLN
INTRAVENOUS | Status: DC | PRN
  Administered 2013-05-17: 100 mg via INTRAVENOUS

## 2013-05-17 MED ORDER — SUCCINYLCHOLINE CHLORIDE 20 MG/ML IJ SOLN
INTRAMUSCULAR | Status: DC | PRN
  Administered 2013-05-17: 50 mg via INTRAVENOUS

## 2013-05-17 MED ORDER — BENAZEPRIL HCL 40 MG PO TABS
40.0000 mg | ORAL_TABLET | Freq: Every day | ORAL | Status: DC
  Administered 2013-05-17 – 2013-05-19 (×3): 40 mg via ORAL
  Filled 2013-05-17 (×3): qty 1

## 2013-05-17 MED ORDER — CLOPIDOGREL BISULFATE 75 MG PO TABS
75.0000 mg | ORAL_TABLET | Freq: Every day | ORAL | Status: DC
  Administered 2013-05-17 – 2013-05-19 (×3): 75 mg via ORAL
  Filled 2013-05-17 (×3): qty 1

## 2013-05-17 MED ORDER — HYDROCODONE-ACETAMINOPHEN 5-325 MG PO TABS
1.0000 | ORAL_TABLET | ORAL | Status: DC | PRN
  Administered 2013-05-17 – 2013-05-19 (×2): 2 via ORAL
  Filled 2013-05-17 (×2): qty 2

## 2013-05-17 SURGICAL SUPPLY — 51 items
BANDAGE ESMARK 6X9 LF (GAUZE/BANDAGES/DRESSINGS) IMPLANT
BANDAGE GAUZE ELAST BULKY 4 IN (GAUZE/BANDAGES/DRESSINGS) ×2 IMPLANT
BLADE SAW RECIP 87.9 MT (BLADE) ×2 IMPLANT
BLADE SURG 21 STRL SS (BLADE) ×2 IMPLANT
BNDG CMPR 9X6 STRL LF SNTH (GAUZE/BANDAGES/DRESSINGS)
BNDG COHESIVE 6X5 TAN STRL LF (GAUZE/BANDAGES/DRESSINGS) ×2 IMPLANT
BNDG ESMARK 6X9 LF (GAUZE/BANDAGES/DRESSINGS)
CLOTH BEACON ORANGE TIMEOUT ST (SAFETY) ×2 IMPLANT
COVER SURGICAL LIGHT HANDLE (MISCELLANEOUS) ×2 IMPLANT
CUFF TOURNIQUET SINGLE 34IN LL (TOURNIQUET CUFF) ×2 IMPLANT
CUFF TOURNIQUET SINGLE 44IN (TOURNIQUET CUFF) IMPLANT
DRAIN PENROSE 1/2X12 LTX STRL (WOUND CARE) IMPLANT
DRAPE EXTREMITY T 121X128X90 (DRAPE) ×2 IMPLANT
DRAPE LAPAROSCOPIC ABDOMINAL (DRAPES) IMPLANT
DRAPE PROXIMA HALF (DRAPES) ×2 IMPLANT
DRAPE U-SHAPE 47X51 STRL (DRAPES) ×2 IMPLANT
DRSG ADAPTIC 3X8 NADH LF (GAUZE/BANDAGES/DRESSINGS) ×2 IMPLANT
DRSG PAD ABDOMINAL 8X10 ST (GAUZE/BANDAGES/DRESSINGS) ×2 IMPLANT
DURAPREP 26ML APPLICATOR (WOUND CARE) ×2 IMPLANT
ELECT REM PT RETURN 9FT ADLT (ELECTROSURGICAL) ×2
ELECTRODE REM PT RTRN 9FT ADLT (ELECTROSURGICAL) ×1 IMPLANT
EVACUATOR 1/8 PVC DRAIN (DRAIN) IMPLANT
GLOVE BIO SURGEON STRL SZ 6.5 (GLOVE) ×2 IMPLANT
GLOVE BIO SURGEON STRL SZ7.5 (GLOVE) ×2 IMPLANT
GLOVE BIOGEL PI IND STRL 7.5 (GLOVE) ×1 IMPLANT
GLOVE BIOGEL PI IND STRL 9 (GLOVE) ×1 IMPLANT
GLOVE BIOGEL PI INDICATOR 7.5 (GLOVE) ×1
GLOVE BIOGEL PI INDICATOR 9 (GLOVE) ×1
GLOVE NEODERM STER SZ 7 (GLOVE) ×2 IMPLANT
GLOVE SURG ORTHO 9.0 STRL STRW (GLOVE) ×4 IMPLANT
GOWN PREVENTION PLUS XLARGE (GOWN DISPOSABLE) ×2 IMPLANT
GOWN SRG XL XLNG 56XLVL 4 (GOWN DISPOSABLE) ×2 IMPLANT
GOWN STRL NON-REIN XL XLG LVL4 (GOWN DISPOSABLE) ×4
KIT BASIN OR (CUSTOM PROCEDURE TRAY) ×2 IMPLANT
KIT ROOM TURNOVER OR (KITS) ×2 IMPLANT
MANIFOLD NEPTUNE II (INSTRUMENTS) ×2 IMPLANT
NS IRRIG 1000ML POUR BTL (IV SOLUTION) ×2 IMPLANT
PACK GENERAL/GYN (CUSTOM PROCEDURE TRAY) ×2 IMPLANT
PAD ARMBOARD 7.5X6 YLW CONV (MISCELLANEOUS) ×2 IMPLANT
SPONGE GAUZE 4X4 12PLY (GAUZE/BANDAGES/DRESSINGS) ×2 IMPLANT
SPONGE LAP 18X18 X RAY DECT (DISPOSABLE) IMPLANT
STAPLER VISISTAT 35W (STAPLE) ×2 IMPLANT
STOCKINETTE IMPERVIOUS LG (DRAPES) ×2 IMPLANT
SUT PDS AB 1 CT  36 (SUTURE) ×2
SUT PDS AB 1 CT 36 (SUTURE) ×2 IMPLANT
SUT SILK 2 0 (SUTURE) ×1
SUT SILK 2-0 18XBRD TIE 12 (SUTURE) ×1 IMPLANT
TOWEL OR 17X24 6PK STRL BLUE (TOWEL DISPOSABLE) ×2 IMPLANT
TOWEL OR 17X26 10 PK STRL BLUE (TOWEL DISPOSABLE) ×2 IMPLANT
TUBE ANAEROBIC SPECIMEN COL (MISCELLANEOUS) IMPLANT
WATER STERILE IRR 1000ML POUR (IV SOLUTION) IMPLANT

## 2013-05-17 NOTE — H&P (Signed)
Craig Woodard is an 72 y.o. male.   Chief Complaint: Osteomyelitis abscess right lower extremity HPI: Patient is a 72 year old gentleman with diabetes peripheral neuropathy peripheral vascular disease with ulceration right lower extremity he has failed conservative therapy and presents at this time for transtibial amputation.  Past Medical History  Diagnosis Date  . Diabetes mellitus without complication   . Arthritis   . Hypertension   . Peripheral vascular disease     Past Surgical History  Procedure Laterality Date  . Toe amputation  03/08/2013    Right pinkie toe, 4th  . Replacement total knee    . Back surgery    . Vein surgery      right leg  . Rotator cuff repair    . Amputation Right 03/10/2013    Procedure: amputaion right foot fifth ray with right heel skin graft;  Surgeon: Nadara Mustard, MD;  Location: [facility];  Service: Orthopedics;  Laterality: Right;    No family history on file. Social History:  reports that he quit smoking about 35 years ago. His smoking use included Cigarettes. He started smoking about 57 years ago. He has a 33 pack-year smoking history. He has never used smokeless tobacco. He reports that he does not drink alcohol or use illicit drugs.  Allergies: No Known Allergies  No prescriptions prior to admission    No results found for this or any previous visit (from the past 48 hour(s)). No results found.  Review of Systems  All other systems reviewed and are negative.    There were no vitals taken for this visit. Physical Exam  On examination patient has ulceration abscess osteomyelitis right lower extremity Assessment/Plan Assessment: Abscess ulceration osteomyelitis right lower extremity.  Plan: We'll plan for right transtibial amputation. Risks and benefits were discussed including infection neurovascular injury nonhealing of the wound need for higher level amputation. Patient states he understands and wished to proceed at this  time.  Derold Dorsch V 05/17/2013, 6:10 AM

## 2013-05-17 NOTE — Op Note (Signed)
OPERATIVE REPORT  DATE OF SURGERY: 05/17/2013  PATIENT:  Craig Woodard,  72 y.o. male  PRE-OPERATIVE DIAGNOSIS:  Osteomyelitis and Gangrene Right Foot  POST-OPERATIVE DIAGNOSIS:  Osteomyelitis and Gangrene Right Foot  PROCEDURE:  Procedure(s): AMPUTATION BELOW KNEE  SURGEON:  Surgeon(s): Nadara Mustard, MD  ANESTHESIA:   general  EBL:  min ML  SPECIMEN:  Source of Specimen:  right leg  TOURNIQUET:   Total Tourniquet Time Documented: Thigh (Right) - 6 minutes Total: Thigh (Right) - 6 minutes   PROCEDURE DETAILS: Patient is a 72 year old gentleman with gangrene osteomyelitis abscess of his right foot. Patient is failed conservative treatment for limb salvage and presents at this time for a right transtibial amputation. Risks and benefits were discussed including infection neurovascular injury nonhealing of the wound need for additional surgery. Patient states he understands and wished to proceed at this time. Description of procedure patient was brought to the operating room and underwent a general anesthetic. After adequate levels of anesthesia were obtained patient's right lower extremity was prepped using DuraPrep draped into a sterile field and the foot was draped out of the sterile field with an impervious stockinette. An incision was made 11 cm distal to the tibial tubercle this curved proximally and a large posterior flap was created. The tibia was transected proximal to the skin incision and beveled anteriorly the fibula was transected just proximal to the tibial incision. A knife was used to create a large posterior flap. The sciatic nerve was pulled cut and allowed to retract. The vascular bundles were essentially completely calcified the vascular bundles were suture ligated with 2-0 silk. The tourniquet was deflated hemostasis was obtained. The deep and superficial fascial layers were closed using #1 PDS. The skin was closed using staples. The wound is covered with Adaptic  orthopedic sponges AB dressing Kerlix and Coban. Patient was extubated taken to the PACU in stable condition.  PLAN OF CARE: Admit to inpatient   PATIENT DISPOSITION:  PACU - hemodynamically stable.   Nadara Mustard, MD 05/17/2013 11:19 AM

## 2013-05-17 NOTE — Anesthesia Postprocedure Evaluation (Signed)
  Anesthesia Post-op Note  Patient: Craig Woodard  Procedure(s) Performed: Procedure(s) with comments: AMPUTATION BELOW KNEE (Right) - Right Below Knee Amputation  Patient Location: PACU  Anesthesia Type:General  Level of Consciousness: awake, alert , oriented and patient cooperative  Airway and Oxygen Therapy: Patient Spontanous Breathing  Post-op Pain: mild  Post-op Assessment: Post-op Vital signs reviewed, Patient's Cardiovascular Status Stable, Respiratory Function Stable, Patent Airway, No signs of Nausea or vomiting and Pain level controlled  Post-op Vital Signs: stable  Complications: No apparent anesthesia complications

## 2013-05-17 NOTE — Transfer of Care (Signed)
Immediate Anesthesia Transfer of Care Note  Patient: Craig Woodard  Procedure(s) Performed: Procedure(s) with comments: AMPUTATION BELOW KNEE (Right) - Right Below Knee Amputation  Patient Location: PACU  Anesthesia Type:General  Level of Consciousness: awake, alert  and oriented  Airway & Oxygen Therapy: Patient Spontanous Breathing and Patient connected to nasal cannula oxygen  Post-op Assessment: Report given to PACU RN and Post -op Vital signs reviewed and stable  Post vital signs: Reviewed and stable  Complications: No apparent anesthesia complications

## 2013-05-17 NOTE — Anesthesia Preprocedure Evaluation (Signed)
Anesthesia Evaluation  Patient identified by MRN, date of birth, ID band Patient awake    Reviewed: Allergy & Precautions, H&P , NPO status , Patient's Chart, lab work & pertinent test results  Airway       Dental   Pulmonary          Cardiovascular hypertension, + Peripheral Vascular Disease     Neuro/Psych    GI/Hepatic   Endo/Other  diabetes, Type 2, Oral Hypoglycemic Agents  Renal/GU      Musculoskeletal   Abdominal   Peds  Hematology  (+) Blood dyscrasia, ,   Anesthesia Other Findings   Reproductive/Obstetrics                           Anesthesia Physical Anesthesia Plan  ASA: III  Anesthesia Plan: General   Post-op Pain Management:    Induction: Intravenous  Airway Management Planned: Oral ETT and LMA  Additional Equipment:   Intra-op Plan:   Post-operative Plan: Extubation in OR  Informed Consent: I have reviewed the patients History and Physical, chart, labs and discussed the procedure including the risks, benefits and alternatives for the proposed anesthesia with the patient or authorized representative who has indicated his/her understanding and acceptance.     Plan Discussed with:   Anesthesia Plan Comments:         Anesthesia Quick Evaluation

## 2013-05-17 NOTE — Progress Notes (Signed)
ANTICOAGULATION CONSULT NOTE - Initial Consult  Pharmacy Consult for Coumadin Indication: PVD, DVT prophylaxis  No Known Allergies  Patient Measurements: Height: 6\' 1"  (185.4 cm) Weight: 191 lb (86.637 kg) IBW/kg (Calculated) : 79.9  Labs:  Recent Labs  05/17/13 0812  HGB 12.3*  HCT 36.8*  PLT 215  APTT 51*  LABPROT 25.4*  INR 2.44*  CREATININE 0.89    Estimated Creatinine Clearance: 88.5 ml/min (by C-G formula based on Cr of 0.89).   Medical History: Past Medical History  Diagnosis Date  . Diabetes mellitus without complication   . Arthritis   . Hypertension   . Peripheral vascular disease   . History of MRSA infection     2 yrs ago    Assessment: 72 year old male who is s/p transtibial amputation.  On Coumadin prior to admission for PVD.  INR today is 2.44  Dose PTA = 4 mg daily  Goal of Therapy:  INR 2-3 Monitor platelets by anticoagulation protocol: Yes   Plan:  1) Coumadin 4 mg po daily at 1800 pm 2) Daily INR  Thank you. Okey Regal, PharmD [phone]  05/17/2013,2:58 PM

## 2013-05-17 NOTE — Progress Notes (Signed)
UR COMPLETED  

## 2013-05-18 ENCOUNTER — Encounter (HOSPITAL_COMMUNITY): Payer: Self-pay | Admitting: Orthopedic Surgery

## 2013-05-18 LAB — GLUCOSE, CAPILLARY
Glucose-Capillary: 100 mg/dL — ABNORMAL HIGH (ref 70–99)
Glucose-Capillary: 345 mg/dL — ABNORMAL HIGH (ref 70–99)
Glucose-Capillary: 167 mg/dL — ABNORMAL HIGH (ref 70–99)

## 2013-05-18 MED ORDER — INSULIN ASPART 100 UNIT/ML ~~LOC~~ SOLN
0.0000 [IU] | Freq: Three times a day (TID) | SUBCUTANEOUS | Status: DC
  Administered 2013-05-18: 2 [IU] via SUBCUTANEOUS
  Administered 2013-05-18: 7 [IU] via SUBCUTANEOUS
  Administered 2013-05-19: 2 [IU] via SUBCUTANEOUS

## 2013-05-18 NOTE — Clinical Social Work Psychosocial (Signed)
Clinical Social Work Department  BRIEF PSYCHOSOCIAL ASSESSMENT  Patient:Craig Woodard  Account Number: [id number]  Admit date: 05/17/13 Clinical Social Worker Sabino Niemann, MSW Date/Time: 05/18/2013 4:00PM Referred by: Physician Date Referred: 05/18/2013 Referred for   SNF Placement   Other Referral:  Interview type: Patient and patient's family Other interview type: PSYCHOSOCIAL DATA  Living Status: Wife Admitted from facility: [facility] Level of care: SNF Primary support name: Cibrian,Nancy  Primary support relationship to patient: Wife Degree of support available:  Strong and vested  CURRENT CONCERNS  Current Concerns   Post-Acute Placement   Other Concerns:  SOCIAL WORK ASSESSMENT / PLAN  CSW met with pt re: PT recommendation returning to SNF.   Pt lives with with his spouse but was at [facility] for Rehab  Patient is agreeable to returning to SNF    CSW completed FL2 and sent clinicals to [facility].     Assessment/plan status: Information/Referral to [facility]  Other assessment/ plan:  Information/referral to community resources:  SNF   PTAR  PATIENT'S/FAMILY'S RESPONSE TO PLAN OF CARE:  Pt  reports he is agreeable to returning to [facility] in order to increase strength and independence with mobility prior to returning home  Pt verbalized understanding of placement process and appreciation for CSW assist.   Sabino Niemann, MSW [phone]

## 2013-05-18 NOTE — Evaluation (Signed)
Occupational Therapy Evaluation Patient Details Name: Craig Woodard MRN: [id number] DOB: 25-Nov-1941 Today's Date: 05/18/2013 Time: 4098-1191 OT Time Calculation (min): 23 min  OT Assessment / Plan / Recommendation Clinical Impression  Pt demos decline in function with ADLs and ADL mobility safety following R BKA. Pt previously at a SNF participating in rehab and he would like to return there to continue with therapy. Pt would be an excellent candidate for CIR, however he prefers where he was perviously in [facility] area. Pt would benefit from acute OT services to address impairments to maximize level of function and safety    OT Assessment  Patient needs continued OT Services    Follow Up Recommendations  CIR;SNF;Other (comment) (excellent CIR candidate, but prefers SNF)    Barriers to Discharge Decreased caregiver support    Equipment Recommendations  None recommended by OT;Other (comment) (TBD )    Recommendations for Other Services    Frequency  Min 2X/week    Precautions / Restrictions Precautions Precautions: Fall Restrictions Weight Bearing Restrictions: Yes RLE Weight Bearing: Non weight bearing Other Position/Activity Restrictions:  pt. with R BKA       ADL  Grooming: Performed;Set up Where Assessed - Grooming: Unsupported sitting Upper Body Bathing: Simulated;Supervision/safety;Set up Lower Body Bathing: Simulated;Maximal assistance Upper Body Dressing: Performed;Supervision/safety;Set up Lower Body Dressing: Performed;Maximal assistance;+1 Total assistance Toilet Transfer: Moderate assistance;Simulated Toilet Transfer Method: Sit to stand;Stand pivot Toileting - Clothing Manipulation and Hygiene: Performed;+1 Total assistance Tub/Shower Transfer Method: Not assessed Transfers/Ambulation Related to ADLs: cues for correct hand placement    OT Diagnosis: Generalized weakness;Acute pain  OT Problem List: Decreased strength;Pain;Decreased activity  tolerance;Decreased knowledge of precautions;Decreased knowledge of use of DME or AE;Impaired balance (sitting and/or standing) OT Treatment Interventions: Self-care/ADL training;Therapeutic activities;Therapeutic exercise;Neuromuscular education;Patient/family education;DME and/or AE instruction;Balance training   OT Goals Acute Rehab OT Goals OT Goal Formulation: With patient Time For Goal Achievement: 05/25/13 Potential to Achieve Goals: Good ADL Goals Pt Will Perform Grooming: with min assist;with mod assist;Standing at sink;Supported ADL Goal: Grooming - Progress: Goal set today Pt Will Perform Lower Body Bathing: with mod assist ADL Goal: Lower Body Bathing - Progress: Goal set today Pt Will Perform Lower Body Dressing: with mod assist;with max assist ADL Goal: Lower Body Dressing - Progress: Goal set today Pt Will Transfer to Toilet: with min assist;with DME;Grab bars ADL Goal: Toilet Transfer - Progress: Goal set today Pt Will Perform Toileting - Clothing Manipulation: with max assist;with mod assist;Standing ADL Goal: Toileting - Clothing Manipulation - Progress: Goal set today Pt Will Perform Toileting - Hygiene: with min assist;Sitting on 3-in-1 or toilet ADL Goal: Toileting - Hygiene - Progress: Goal set today Additional ADL Goal #1: Pt will complete B UE exercises with orange theraband 3 sets 10 reps each ADL Goal: Additional Goal #1 - Progress: Goal set today  Visit Information  Last OT Received On: 05/18/13 Assistance Needed: +1    Subjective Data  Subjective: " I would like to go back to [facility] where I was getting therapy before ' Patient Stated Goal: To return home after therapy at SNF   Prior Functioning     Home Living Lives With: Spouse Available Help at Discharge: Skilled Nursing Facility Bathroom Shower/Tub: Tub/shower unit Bathroom Toilet: Standard Home Adaptive Equipment: Programmer, systems - four wheeled Additional Comments: pt plans to  remodel bathroom  Prior Function Level of Independence: Independent with assistive device(s) Able to Take Stairs?: No Vocation: Retired Musician: No difficulties Dominant Hand:  Right         Vision/Perception Vision - History Baseline Vision: Wears glasses only for reading Patient Visual Report: No change from baseline Perception Perception: Within Functional Limits   Cognition  Cognition Arousal/Alertness: Awake/alert Behavior During Therapy: WFL for tasks assessed/performed Overall Cognitive Status: Within Functional Limits for tasks assessed    Extremity/Trunk Assessment Right Upper Extremity Assessment RUE ROM/Strength/Tone: WFL for tasks assessed RUE Sensation: WFL - Light Touch RUE Coordination: WFL - gross/fine motor Left Upper Extremity Assessment LUE ROM/Strength/Tone: WFL for tasks assessed LUE Sensation: WFL - Light Touch LUE Coordination: WFL - gross/fine motor     Mobility Bed Mobility Bed Mobility: Not assessed Transfers Sit to Stand: 3: Mod assist;With upper extremity assist;With armrests;From chair/3-in-1;From bed Stand to Sit: 3: Mod assist;With upper extremity assist;With armrests;To chair/3-in-1;To bed Details for Transfer Assistance: cues for hand placement and technique     Exercise     Balance Balance Balance Assessed: Yes Static Standing Balance Static Standing - Balance Support: Bilateral upper extremity supported;During functional activity Static Standing - Level of Assistance: 3: Mod assist   End of Session OT - End of Session Equipment Utilized During Treatment: Gait belt;Other (comment) (RW) Activity Tolerance: Patient tolerated treatment well Patient left: in chair  GO     Galen Manila 05/18/2013, 1:36 PM

## 2013-05-18 NOTE — Evaluation (Signed)
Physical Therapy Evaluation Patient Details Name: Craig Woodard MRN: [id number] DOB: December 05, 1940 Today's Date: 05/18/2013 Time: 6295-2841 PT Time Calculation (min): 33 min  PT Assessment / Plan / Recommendation Clinical Impression  Pt. underwent R transtibial  BKA and presents to PT with a decrease in his functional mobility and gait, decreased strength in especially R LE and pain. He needs acute PT while here to facilitate increasing functional level for decreasing burden of care at next venue and eventual DC home.He reports he has been at a rehab facility in [facility] for several months.  Pt. is highly motivated and appears compliant with my instructions to him. Should progress well in rehab .  He may benefit from screen for CIR for appropriateness. Will request screen.    PT Assessment  Patient needs continued PT services    Follow Up Recommendations  CIR;Other (comment) (if not CIR candidate, he will need ST SNF)    Does the patient have the potential to tolerate intense rehabilitation      Barriers to Discharge None      Equipment Recommendations  Other (comment) (TBD)    Recommendations for Other Services Rehab consult   Frequency Min 6X/week    Precautions / Restrictions Precautions Precautions: Fall Restrictions Weight Bearing Restrictions: No Other Position/Activity Restrictions:  pt. with R BKA   Pertinent Vitals/Pain See vitals tab       Mobility  Bed Mobility Bed Mobility: Not assessed (pt.up in WC) Transfers Transfers: Sit to Stand;Stand to Sit Sit to Stand: 3: Mod assist;With upper extremity assist;With armrests;From chair/3-in-1 Stand to Sit: 3: Mod assist;With upper extremity assist;With armrests;To chair/3-in-1 Details for Transfer Assistance: cues for hand placement and technique Ambulation/Gait Ambulation/Gait Assistance: Not tested (comment)    Exercises Amputee Exercises Quad Sets: AROM;Right;10 reps;Seated Gluteal Sets: AROM;Both;10  reps;Standing Hip Extension: AROM;10 reps;Right;Standing Knee Extension: 10 reps;Right;Standing   PT Diagnosis: Difficulty walking;Abnormality of gait;Acute pain  PT Problem List: Decreased strength;Decreased activity tolerance;Decreased balance;Decreased mobility;Decreased knowledge of use of DME;Decreased knowledge of precautions;Pain PT Treatment Interventions: DME instruction;Gait training;Functional mobility training;Therapeutic activities;Therapeutic exercise;Balance training;Wheelchair mobility training;Patient/family education   PT Goals Acute Rehab PT Goals PT Goal Formulation: With patient Time For Goal Achievement: 05/25/13 Potential to Achieve Goals: Good Pt will go Supine/Side to Sit: with supervision PT Goal: Supine/Side to Sit - Progress: Goal set today Pt will go Sit to Supine/Side: with supervision PT Goal: Sit to Supine/Side - Progress: Goal set today Pt will go Sit to Stand: with supervision PT Goal: Sit to Stand - Progress: Goal set today Pt will go Stand to Sit: with supervision PT Goal: Stand to Sit - Progress: Goal set today Pt will Transfer Bed to Chair/Chair to Bed: with supervision PT Transfer Goal: Bed to Chair/Chair to Bed - Progress: Goal set today Pt will Perform Home Exercise Program: with supervision, verbal cues required/provided  Visit Information  Last PT Received On: 05/18/13 Assistance Needed: +1 (second person to attempt walking)    Subjective Data  Subjective: "I'm having some nausea"  (med requested) Patient Stated Goal: I want to get back to walking   Prior Functioning  Home Living Lives With: Spouse Available Help at Discharge: Skilled Nursing Facility;Other (Comment) (plans to return to rehab at [facility]) Prior Function Level of Independence: Independent with assistive device(s) (at wheelchair level) Able to Take Stairs?: No Communication Communication: No difficulties    Cognition  Cognition Arousal/Alertness:  Awake/alert Behavior During Therapy: WFL for tasks assessed/performed Overall Cognitive Status: Within Functional Limits  for tasks assessed    Extremity/Trunk Assessment Right Upper Extremity Assessment RUE ROM/Strength/Tone: WFL for tasks assessed RUE Sensation: WFL - Light Touch RUE Coordination: WFL - gross/fine motor Left Upper Extremity Assessment LUE ROM/Strength/Tone: WFL for tasks assessed LUE Sensation: WFL - Light Touch LUE Coordination: WFL - gross/fine motor Right Lower Extremity Assessment RLE ROM/Strength/Tone: Deficits;Unable to fully assess;Due to pain RLE ROM/Strength/Tone Deficits: fair quad set (-15 degrees extension at knee) RLE Sensation: WFL - Light Touch Left Lower Extremity Assessment LLE ROM/Strength/Tone: WFL for tasks assessed LLE Sensation: WFL - Light Touch LLE Coordination: WFL - gross/fine motor Trunk Assessment Trunk Assessment: Normal   Balance Balance Balance Assessed: Yes Static Standing Balance Static Standing - Balance Support: Bilateral upper extremity supported;During functional activity Static Standing - Level of Assistance: 3: Mod assist Static Standing - Comment/# of Minutes: 3  End of Session PT - End of Session Equipment Utilized During Treatment: Gait belt Activity Tolerance: Treatment limited secondary to medical complications (Comment);Other (comment);Patient limited by pain (pt. nauseated and limited by pain) Patient left: in chair;with call bell/phone within reach;Other (comment) (pt. in reclining WC from rehab facility) Nurse Communication: Mobility status;Patient requests pain meds;Other (comment) (request for nausea med)  GP     Ferman Hamming 05/18/2013, 8:31 AM Weldon Picking PT Acute Rehab Services [phone] Beeper [phone]

## 2013-05-18 NOTE — Progress Notes (Signed)
Rehab Admissions Coordinator Note:  Patient was screened by Trish Mage for appropriateness for an Inpatient Acute Rehab Consult. Noted patient was from a SNF prior to admission.  Unless there is a plan for discharge home and not return to SNF, there is no need for an inpatient rehab consult.   At this time, we are recommending Skilled Nursing Facility.  Call me for questions.    Trish Mage 05/18/2013, 9:05 AM  I can be reached at [phone].

## 2013-05-18 NOTE — Progress Notes (Signed)
ANTICOAGULATION CONSULT NOTE - Initial Consult  Pharmacy Consult for Coumadin Indication: PVD, DVT prophylaxis  No Known Allergies  Patient Measurements: Height: 6\' 1"  (185.4 cm) Weight: 191 lb (86.637 kg) IBW/kg (Calculated) : 79.9  Labs:  Recent Labs  05/17/13 0812 05/18/13 0535  HGB 12.3*  --   HCT 36.8*  --   PLT 215  --   APTT 51*  --   LABPROT 25.4* 25.8*  INR 2.44* 2.50*  CREATININE 0.89  --     Estimated Creatinine Clearance: 88.5 ml/min (by C-G formula based on Cr of 0.89).   Medical History: Past Medical History  Diagnosis Date  . Diabetes mellitus without complication   . Arthritis   . Hypertension   . Peripheral vascular disease   . History of MRSA infection     2 yrs ago    Assessment: 71 year old male who is s/p transtibial amputation.  On Coumadin prior to admission for PVD.  INR (2.5) remains therapeutic on home dose. No bleeding noted per chart.  Dose PTA = 4 mg daily  Goal of Therapy:  INR 2-3 Monitor platelets by anticoagulation protocol: Yes   Plan:  1) Coumadin 4 mg po daily at 1800 pm 2) Daily INR  Bayard Hugger, PharmD, BCPS  Clinical Pharmacist  Pager: [phone]   05/18/2013,11:49 AM

## 2013-05-18 NOTE — Progress Notes (Signed)
Patient ID: Craig Woodard, male   DOB: February 12, 1941, 72 y.o.   MRN: [id number] Patient sitting up in a chair and comfortable this morning. Dressing clean and dry. Plan for discharge back to skilled nursing tomorrow Friday.

## 2013-05-18 NOTE — Plan of Care (Signed)
Problem: Phase II Progression Outcomes Goal: Discharge plan established Pt prefers to d/c to SNF although he is an excellent candidate for CIR

## 2013-05-19 LAB — GLUCOSE, CAPILLARY: Glucose-Capillary: 195 mg/dL — ABNORMAL HIGH (ref 70–99)

## 2013-05-19 MED ORDER — OXYCODONE-ACETAMINOPHEN 5-325 MG PO TABS: 1.0000 | ORAL_TABLET | ORAL | Status: DC | PRN

## 2013-05-19 NOTE — Progress Notes (Signed)
Patient discharged in stable conditon via wheelchair to SNF.

## 2013-05-19 NOTE — Progress Notes (Signed)
Clinical social worker assisted with patient discharge to skilled nursing facility, [facility].  CSW addressed all family questions and concerns. CSW copied chart and added all important documents. C Clinical Social Worker will sign off for now as social work intervention is no longer needed.   Sabino Niemann, MSW,  [phone]

## 2013-05-19 NOTE — Discharge Summary (Signed)
Physician Discharge Summary  Patient ID: Craig Woodard MRN: [id number] DOB/AGE: 01-31-1967 72 y.o.  Admit date: 05/17/2013 Discharge date: 05/19/2013  Admission Diagnoses: Osteomyelitis abscess ulceration right foot  Discharge Diagnoses: Same Active Problems:   * No active hospital problems. *   Discharged Condition: stable  Hospital Course: Patient's hospital course was essentially unremarkable. He underwent a right transtibial amputation. Postoperatively patient progressed well he was stable pain decreased and he was discharged back to skilled nursing in stable condition.  Consults: None  Significant Diagnostic Studies: labs: Routine labs  Treatments: surgery: See operative note  Discharge Exam: Blood pressure 130/57, pulse 80, temperature 99 F (37.2 C), temperature source Oral, resp. rate 19, height 6\' 1"  (1.854 m), weight 86.637 kg (191 lb), SpO2 98.00%. Incision/Wound: dressing clean dry and intact  Disposition: 03-Skilled Nursing Facility  Discharge Orders   Future Orders Complete By Expires     Call MD / Call 911  As directed     Comments:      If you experience chest pain or shortness of breath, CALL 911 and be transported to the hospital emergency room.  If you develope a fever above 101 F, pus (white drainage) or increased drainage or redness at the wound, or calf pain, call your surgeon's office.    Constipation Prevention  As directed     Comments:      Drink plenty of fluids.  Prune juice may be helpful.  You may use a stool softener, such as Colace (over the counter) 100 mg twice a day.  Use MiraLax (over the counter) for constipation as needed.    Diet - low sodium heart healthy  As directed     Increase activity slowly as tolerated  As directed         Medication List    TAKE these medications       amLODipine 10 MG tablet  Commonly known as:  NORVASC  Take 10 mg by mouth daily.     benazepril 40 MG tablet  Commonly known as:  LOTENSIN  Take 40  mg by mouth daily.     carvedilol 25 MG tablet  Commonly known as:  COREG  Take 25 mg by mouth 2 (two) times daily with a meal.     clopidogrel 75 MG tablet  Commonly known as:  PLAVIX  Take 75 mg by mouth daily.     leflunomide 20 MG tablet  Commonly known as:  ARAVA  Take 20 mg by mouth daily.     metFORMIN 1000 MG tablet  Commonly known as:  GLUCOPHAGE  Take 1,000 mg by mouth 2 (two) times daily with a meal.     multivitamin with minerals tablet  Take 1 tablet by mouth daily.     ondansetron 4 MG disintegrating tablet  Commonly known as:  ZOFRAN-ODT  Place 4 mg under the tongue every morning.     oxyCODONE-acetaminophen 5-325 MG per tablet  Commonly known as:  PERCOCET/ROXICET  Take 1 tablet by mouth every 6 (six) hours as needed for pain. For pain     oxyCODONE-acetaminophen 5-325 MG per tablet  Commonly known as:  ROXICET  Take 1 tablet by mouth every 4 (four) hours as needed for pain.     pantoprazole 40 MG tablet  Commonly known as:  PROTONIX  Take 1 tablet (40 mg total) by mouth daily.     senna-docusate 8.6-50 MG per tablet  Commonly known as:  Senokot-S  Take 1 tablet by  mouth daily as needed for constipation. For constipation     tamsulosin 0.4 MG Caps  Commonly known as:  FLOMAX  Take 0.4 mg by mouth daily.     traMADol 50 MG tablet  Commonly known as:  ULTRAM  Take 50 mg by mouth every 6 (six) hours as needed for pain (pain).     vitamin C 500 MG tablet  Commonly known as:  ASCORBIC ACID  Take 500 mg by mouth daily. Take x30 days     warfarin 4 MG tablet  Commonly known as:  COUMADIN  Take 4 mg by mouth every evening.           Follow-up Information   Follow up with Odell Choung V, MD In 2 weeks.   Contact information:   [address] [address] [place] [address] [phone]       Signed: Nadara Mustard 05/19/2013, 6:33 AM

## 2013-05-26 ENCOUNTER — Telehealth: Payer: Self-pay | Admitting: Family Medicine

## 2013-05-29 NOTE — Telephone Encounter (Signed)
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 She needs letter of doucumentation stating diabetes caused these problems and he will get more disability.  Wife states first diagnosed was here at this office 2007. Wife aware Dr Modesto Charon out of town until July 7,2014.

## 2013-05-29 NOTE — Telephone Encounter (Signed)
Will have to wait for Dr. Modesto Charon to return on July 7

## 2013-06-09 ENCOUNTER — Telehealth: Payer: Self-pay | Admitting: Family Medicine

## 2013-06-09 NOTE — Telephone Encounter (Signed)
Per wife "dont remember" the date but states he did see Dr Celene Skeen and they dont remember. She said to put the date when you first started treated.

## 2013-06-13 NOTE — Telephone Encounter (Signed)
Please advise 

## 2013-06-19 ENCOUNTER — Encounter: Payer: Self-pay | Admitting: Family Medicine

## 2013-06-19 NOTE — Telephone Encounter (Signed)
Letter done

## 2013-06-19 NOTE — Telephone Encounter (Signed)
Wife aware to pick letter up

## 2013-06-29 ENCOUNTER — Other Ambulatory Visit: Payer: Self-pay | Admitting: Family Medicine

## 2013-06-29 ENCOUNTER — Telehealth: Payer: Self-pay

## 2013-06-29 DIAGNOSIS — E119 Type 2 diabetes mellitus without complications: Secondary | ICD-10-CM

## 2013-06-29 NOTE — Telephone Encounter (Signed)
Done as a historical order. Verbal order by nurse.  Issak Goley P. Modesto Charon, M.D.

## 2013-06-29 NOTE — Telephone Encounter (Signed)
Craig Woodard from [facility] [phone] called and wants to get back on Levemir 20units at bedtime and is on metformin  Would like to stop the humalog if you think is ok Please advise

## 2013-07-07 ENCOUNTER — Telehealth: Payer: Self-pay | Admitting: *Deleted

## 2013-07-07 NOTE — Telephone Encounter (Signed)
Spoke with pt and he will sch appt with dr Modesto Charon within 4 wks and stated he has so many appt but will call later

## 2013-07-07 NOTE — Telephone Encounter (Signed)
FYI Since putting pt back on Levemir 20u q hs, his blood sugar are running 98-178 in am, 180-220 at noon and 115-294 in pm.

## 2013-07-07 NOTE — Telephone Encounter (Signed)
Okay Check to make sure he has a follow up visit preferably with the Pharmacist or with me.

## 2013-07-28 ENCOUNTER — Telehealth: Payer: Self-pay | Admitting: Family Medicine

## 2013-08-01 NOTE — Telephone Encounter (Signed)
Spoke with Craig Woodard his wife and she reports he reached to pick sonething up from floor and slipped out of wheelchair. She stated the nurse Meriam Sprague came and checked him and stataed he "was fine and reported no injury. While talking with wife she reported he has "headache but she thinks its allergies and the nurse will make another visit on Friday. Wife instructed if any problems call Meriam Sprague or [place] at the office. Advised if continues with headache may need office visit and she stated his VS were good on Friday.

## 2013-08-07 ENCOUNTER — Telehealth: Payer: Self-pay | Admitting: Family Medicine

## 2013-08-09 ENCOUNTER — Other Ambulatory Visit: Payer: Self-pay | Admitting: Family Medicine

## 2013-08-09 DIAGNOSIS — Z89519 Acquired absence of unspecified leg below knee: Secondary | ICD-10-CM

## 2013-08-09 DIAGNOSIS — E119 Type 2 diabetes mellitus without complications: Secondary | ICD-10-CM

## 2013-08-09 NOTE — Telephone Encounter (Signed)
DEbra. I did a referral to [facility] to be initiated again for the care of Craig Woodard who had a BKA. Hopefully I did it correctly. Thanks Rosilyn Coachman P. Modesto Charon, M.D.

## 2013-09-14 IMAGING — CR DG FOOT COMPLETE 3+V*R*
3 series · 3 of 3 positions shown · non-contrast
Comparison: None.

CLINICAL DATA: Wound on right fifth toe.  Rule out osteomyelitis.

RIGHT FOOT COMPLETE - 3+ VIEW

[view not recorded (1 of 3)]
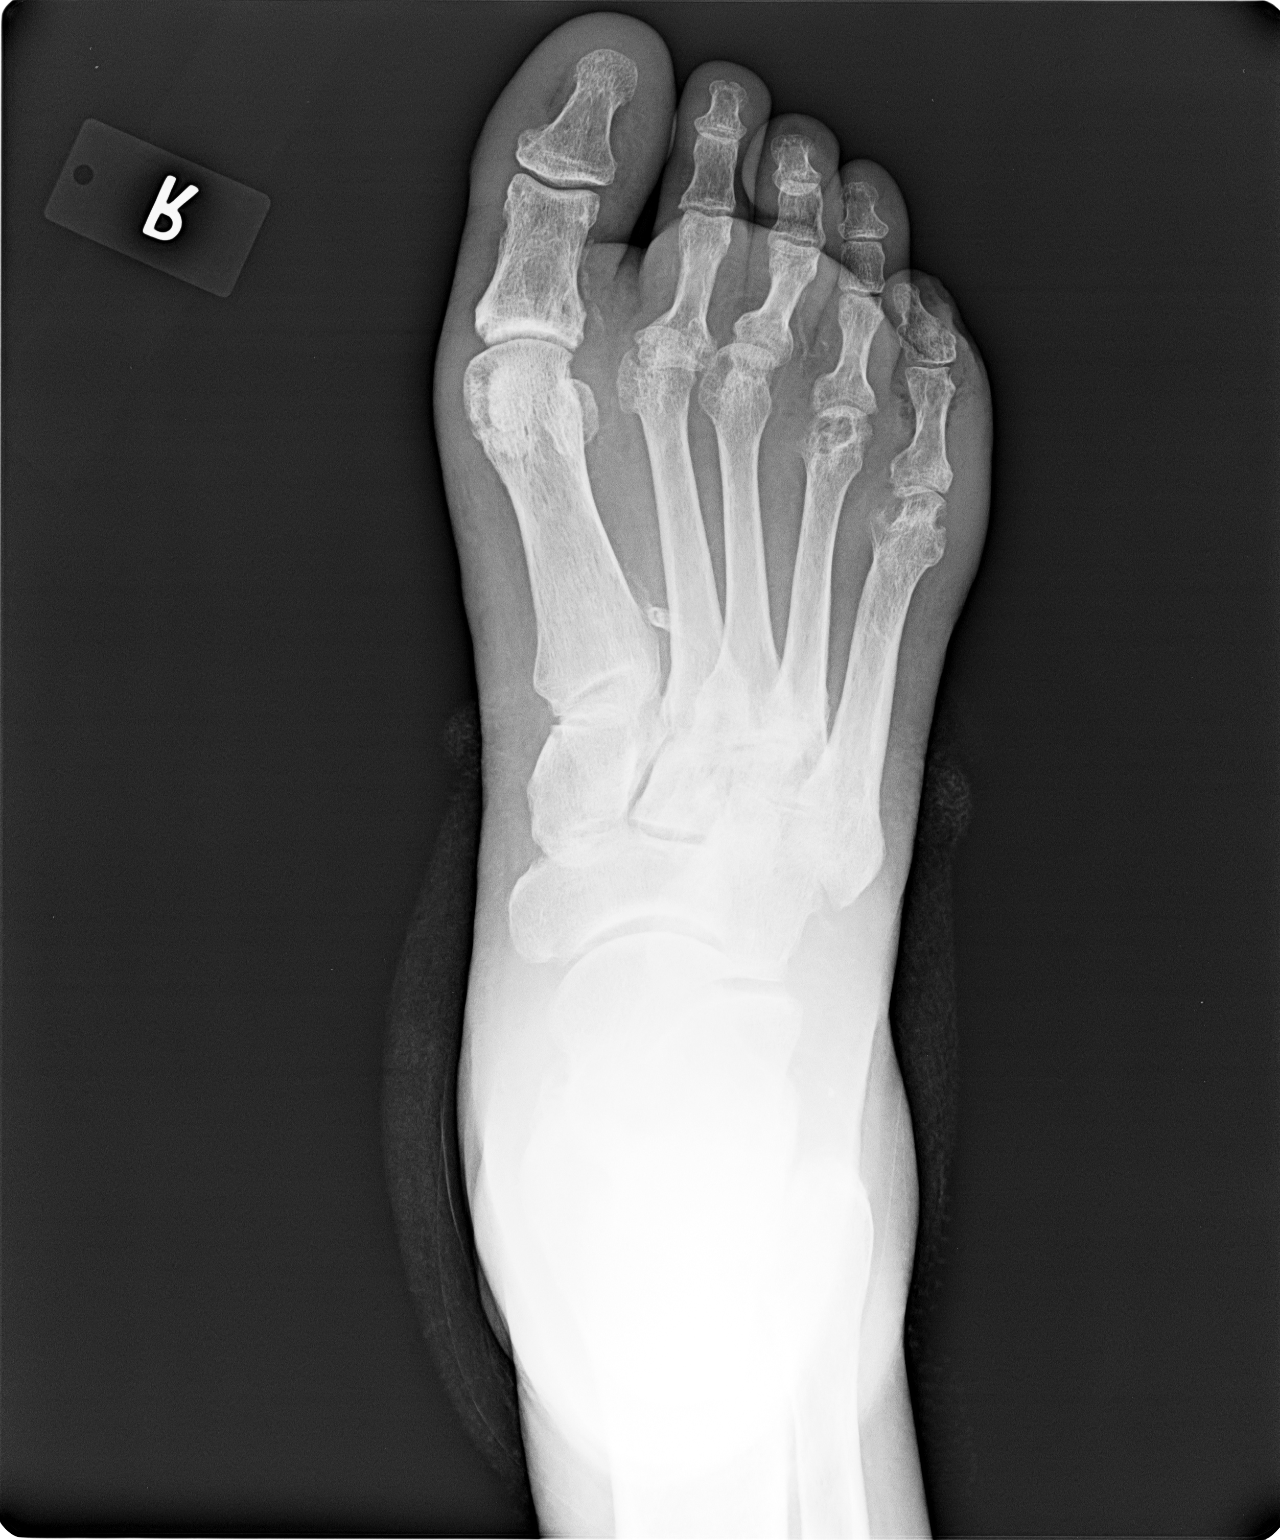

[view not recorded (2 of 3)]
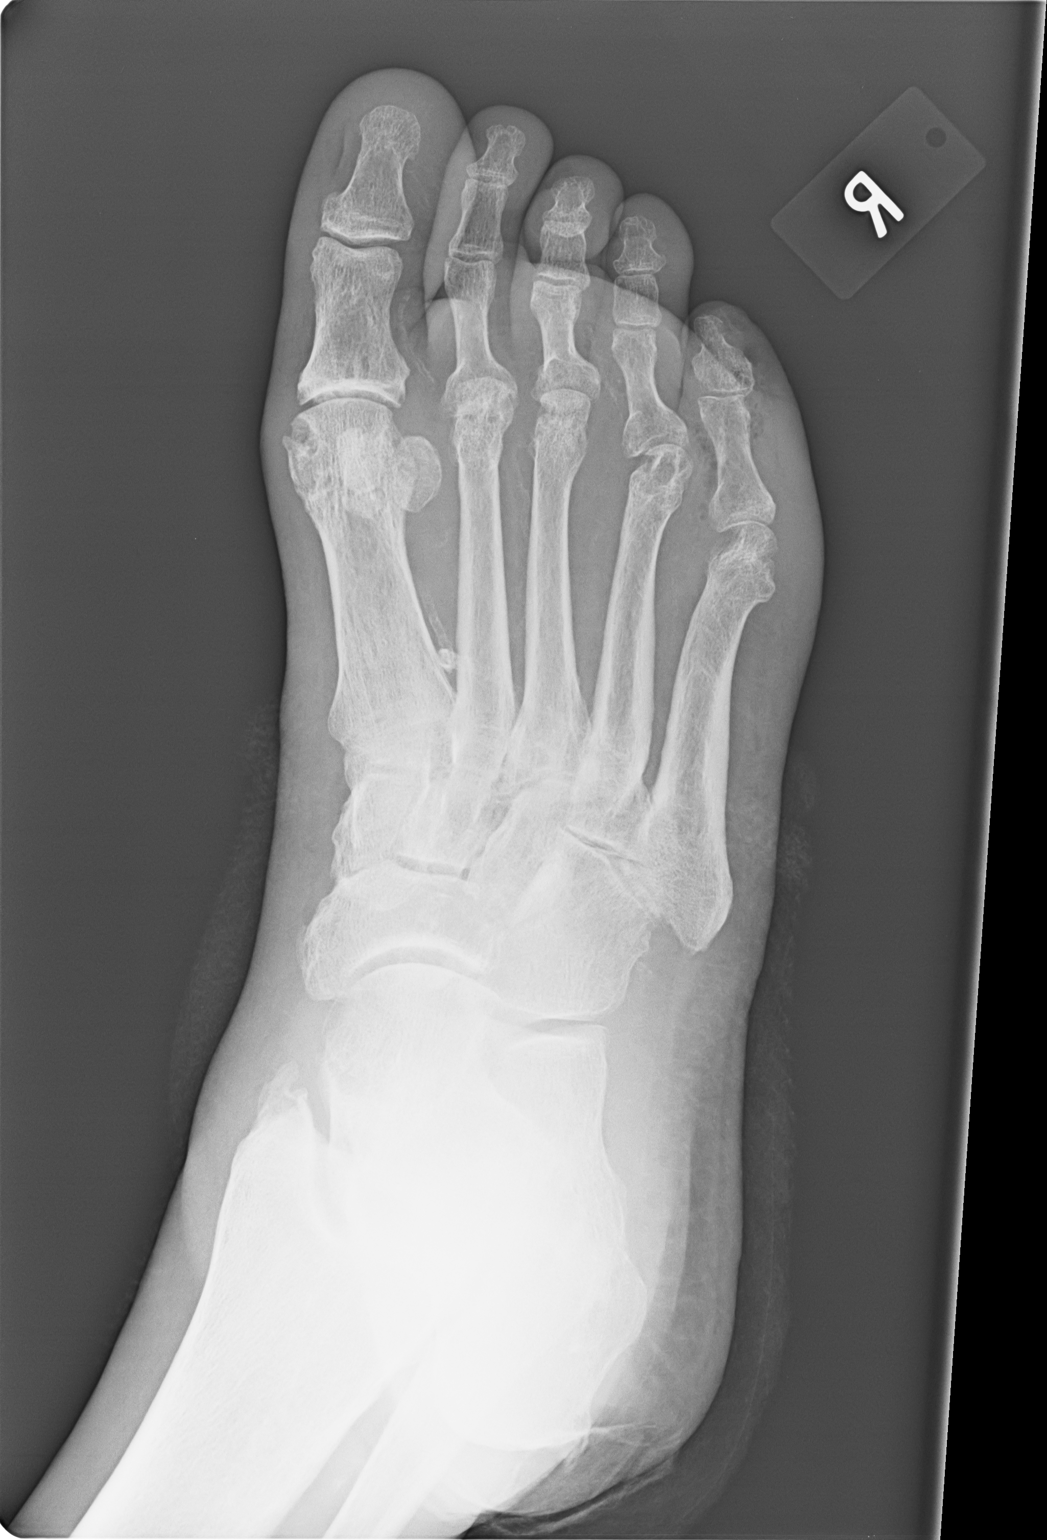

[view not recorded (3 of 3)]
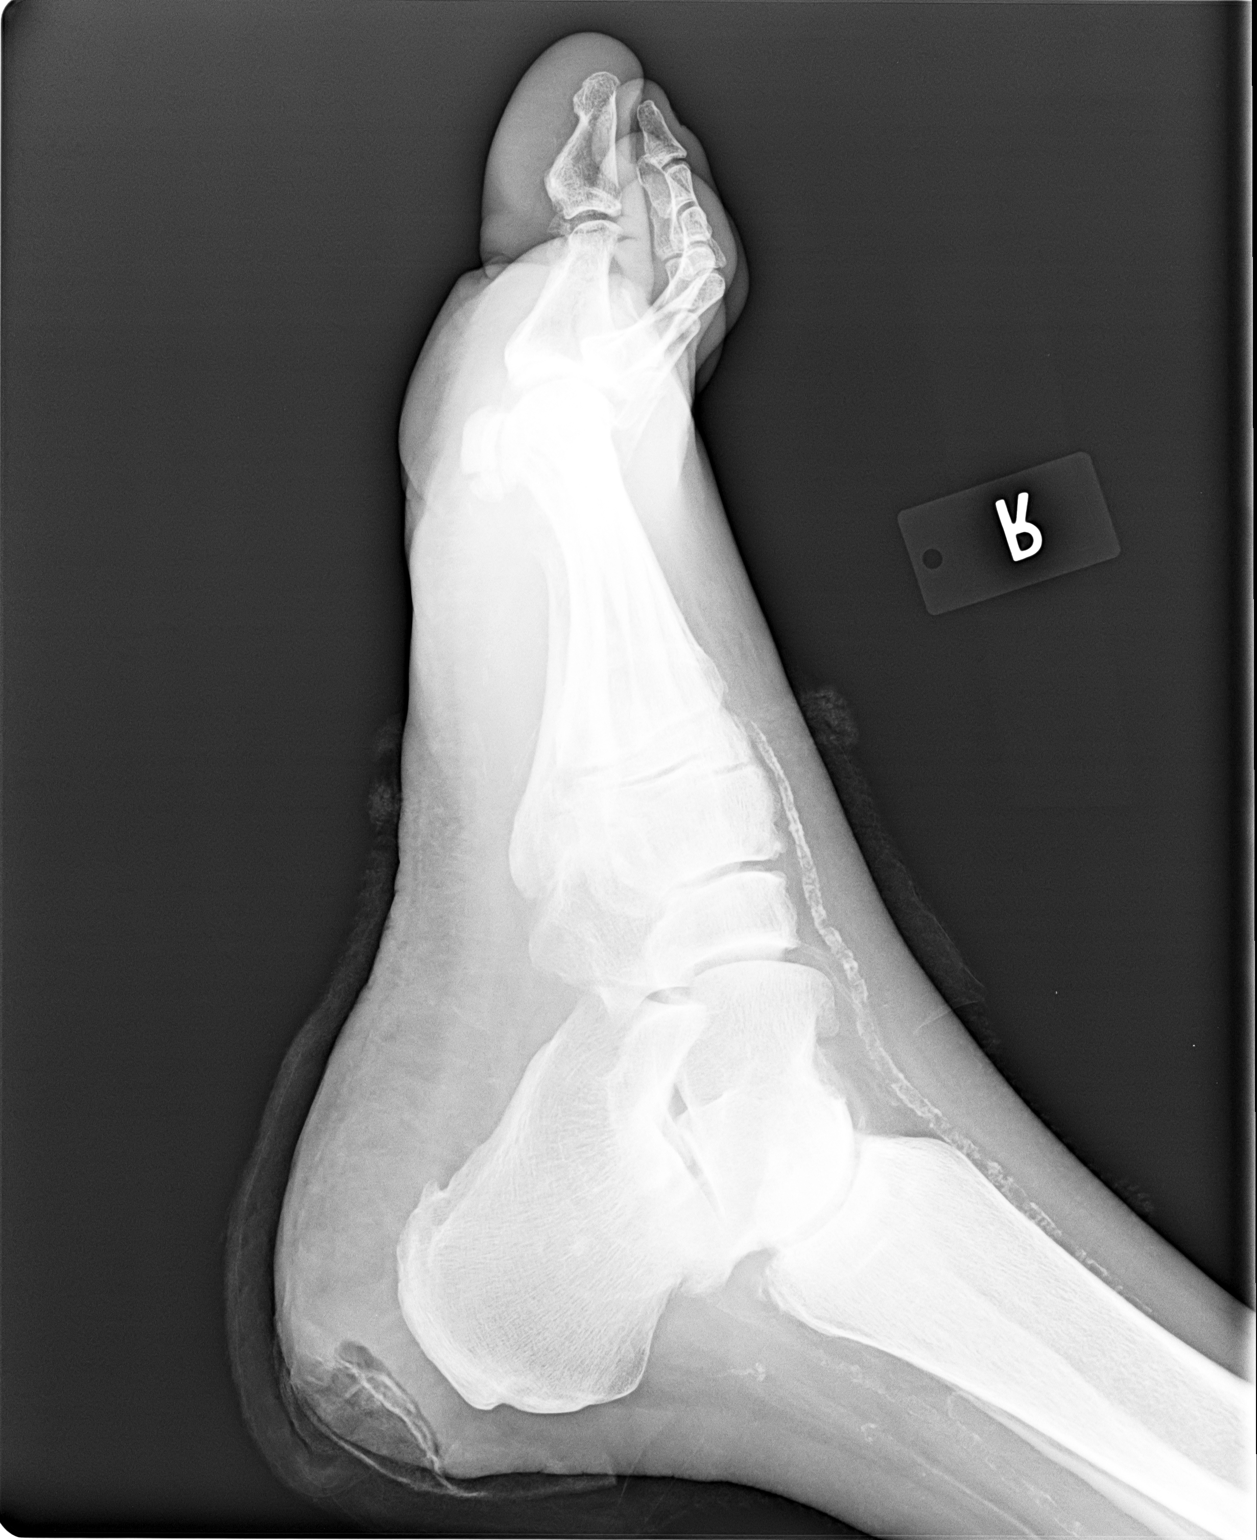

[3 of 3 positions shown; findings below may reference images not displayed]

FINDINGS: Dense vascular calcifications.  A bandage over the
midfoot.  Probable ulcer about the heel on the lateral view.  No
underlying osseous destruction. No radio-opaque foreign body.

There is a moderate amount of soft tissue gas about the fifth
phalanx.  Poorly evaluated on the lateral.  No gross osseous
destruction identified.

There is osseous irregularity about the distal fifth and fourth
metatarsal heads medially.  Similar finding about the medial first
metatarsal head with underlying first MTP joint osteoarthritis.
IMPRESSION: Moderate soft tissue gas about the fifth phalanx, without gross
osseous destruction.  This could represent underlying soft tissue
defect, or even a gas producing infection.  Please note that if
there is an ongoing concern of osteomyelitis, pre and post contrast
MRI is recommended.

Lucencies about the first, fourth, and fifth distal metatarsals are
suspicious for arthropathy such as gouty arthritis.  Clinically
correlate.

Probable ulcer about the heel, without underlying osseous
destruction.

## 2013-09-15 ENCOUNTER — Other Ambulatory Visit: Payer: Medicare Other

## 2014-04-24 ENCOUNTER — Other Ambulatory Visit (INDEPENDENT_AMBULATORY_CARE_PROVIDER_SITE_OTHER): Payer: Medicare Other

## 2014-04-24 DIAGNOSIS — Z79899 Other long term (current) drug therapy: Secondary | ICD-10-CM

## 2014-04-24 DIAGNOSIS — M069 Rheumatoid arthritis, unspecified: Secondary | ICD-10-CM

## 2014-04-24 NOTE — Progress Notes (Signed)
Pt came in for labs for dr. Charlestine Night for a cbc and cmp dx: 714.0,v58.69

## 2014-07-31 ENCOUNTER — Other Ambulatory Visit: Payer: Medicare Other

## 2014-07-31 NOTE — Progress Notes (Signed)
PATIENT HERE FOR LABS ORDERED BY Hurley Cisco.

## 2014-09-24 ENCOUNTER — Other Ambulatory Visit: Payer: Medicare Other

## 2014-10-09 ENCOUNTER — Other Ambulatory Visit: Payer: Medicare Other

## 2014-10-23 ENCOUNTER — Other Ambulatory Visit (INDEPENDENT_AMBULATORY_CARE_PROVIDER_SITE_OTHER): Payer: Medicare Other

## 2014-10-23 DIAGNOSIS — E0811 Diabetes mellitus due to underlying condition with ketoacidosis with coma: Secondary | ICD-10-CM

## 2014-10-23 LAB — POCT GLYCOSYLATED HEMOGLOBIN (HGB A1C): Hemoglobin A1C: 8.3

## 2014-10-23 NOTE — Progress Notes (Signed)
Lab only 

## 2014-10-24 LAB — MICROALBUMIN, URINE: MICROALBUM., U, RANDOM: 2018.1 ug/mL — AB (ref 0.0–17.0)

## 2015-01-07 ENCOUNTER — Other Ambulatory Visit: Payer: Medicare Other

## 2015-01-07 DIAGNOSIS — Z7952 Long term (current) use of systemic steroids: Secondary | ICD-10-CM | POA: Diagnosis not present

## 2015-01-07 DIAGNOSIS — M059 Rheumatoid arthritis with rheumatoid factor, unspecified: Secondary | ICD-10-CM | POA: Diagnosis not present

## 2015-02-19 DIAGNOSIS — E119 Type 2 diabetes mellitus without complications: Secondary | ICD-10-CM | POA: Diagnosis not present

## 2015-02-19 DIAGNOSIS — D509 Iron deficiency anemia, unspecified: Secondary | ICD-10-CM | POA: Diagnosis not present

## 2015-02-19 DIAGNOSIS — Z79899 Other long term (current) drug therapy: Secondary | ICD-10-CM | POA: Diagnosis not present

## 2015-02-19 DIAGNOSIS — M059 Rheumatoid arthritis with rheumatoid factor, unspecified: Secondary | ICD-10-CM | POA: Diagnosis not present

## 2015-04-15 ENCOUNTER — Other Ambulatory Visit (INDEPENDENT_AMBULATORY_CARE_PROVIDER_SITE_OTHER): Payer: Medicare Other

## 2015-04-15 DIAGNOSIS — D509 Iron deficiency anemia, unspecified: Secondary | ICD-10-CM

## 2015-04-15 DIAGNOSIS — Z79899 Other long term (current) drug therapy: Secondary | ICD-10-CM

## 2015-04-15 DIAGNOSIS — M069 Rheumatoid arthritis, unspecified: Secondary | ICD-10-CM

## 2015-04-15 NOTE — Progress Notes (Signed)
Lab only for dr Charlestine Night on [facility] req

## 2015-07-18 ENCOUNTER — Other Ambulatory Visit (INDEPENDENT_AMBULATORY_CARE_PROVIDER_SITE_OTHER): Payer: Medicare Other

## 2015-07-18 DIAGNOSIS — E119 Type 2 diabetes mellitus without complications: Secondary | ICD-10-CM | POA: Diagnosis not present

## 2015-07-18 DIAGNOSIS — D509 Iron deficiency anemia, unspecified: Secondary | ICD-10-CM | POA: Diagnosis not present

## 2015-07-18 DIAGNOSIS — M0570 Rheumatoid arthritis with rheumatoid factor of unspecified site without organ or systems involvement: Secondary | ICD-10-CM | POA: Diagnosis not present

## 2015-07-18 DIAGNOSIS — Z79899 Other long term (current) drug therapy: Secondary | ICD-10-CM | POA: Diagnosis not present

## 2015-07-18 NOTE — Progress Notes (Signed)
Labs drawn for Dr Norva Riffle  Patient brought in a hand rec that was sent to reference lab with the blood

## 2015-07-18 NOTE — Progress Notes (Signed)
Lab only 

## 2015-09-11 DIAGNOSIS — E119 Type 2 diabetes mellitus without complications: Secondary | ICD-10-CM | POA: Diagnosis not present

## 2015-09-11 DIAGNOSIS — H40033 Anatomical narrow angle, bilateral: Secondary | ICD-10-CM | POA: Diagnosis not present

## 2015-10-15 ENCOUNTER — Other Ambulatory Visit (INDEPENDENT_AMBULATORY_CARE_PROVIDER_SITE_OTHER): Payer: Medicare Other

## 2015-10-15 DIAGNOSIS — Z79899 Other long term (current) drug therapy: Secondary | ICD-10-CM | POA: Diagnosis not present

## 2015-10-15 DIAGNOSIS — M0570 Rheumatoid arthritis with rheumatoid factor of unspecified site without organ or systems involvement: Secondary | ICD-10-CM | POA: Diagnosis not present

## 2015-10-15 DIAGNOSIS — D509 Iron deficiency anemia, unspecified: Secondary | ICD-10-CM

## 2015-10-15 NOTE — Progress Notes (Signed)
Dx codes m05.70, D50.9, z79.899

## 2015-10-15 NOTE — Progress Notes (Signed)
Lab only ordered by willam truslow, md for a cmp, cbc

## 2015-10-28 ENCOUNTER — Ambulatory Visit (INDEPENDENT_AMBULATORY_CARE_PROVIDER_SITE_OTHER): Payer: Medicare Other | Admitting: Family

## 2015-10-28 ENCOUNTER — Encounter: Payer: Self-pay | Admitting: Family

## 2015-10-28 VITALS — BP 143/75 | HR 81 | Temp 97.8°F

## 2015-10-28 DIAGNOSIS — J0100 Acute maxillary sinusitis, unspecified: Secondary | ICD-10-CM

## 2015-10-28 MED ORDER — AMOXICILLIN-POT CLAVULANATE 875-125 MG PO TABS: 1.0000 | ORAL_TABLET | Freq: Two times a day (BID) | ORAL | Status: AC

## 2015-10-28 MED ORDER — FLUTICASONE PROPIONATE 50 MCG/ACT NA SUSP: 2.0000 | Freq: Every day | NASAL | Status: AC

## 2015-10-28 NOTE — Patient Instructions (Signed)
Sinusitis, Adult Sinusitis is redness, soreness, and inflammation of the paranasal sinuses. Paranasal sinuses are air pockets within the bones of your face. They are located beneath your eyes, in the middle of your forehead, and above your eyes. In healthy paranasal sinuses, mucus is able to drain out, and air is able to circulate through them by way of your nose. However, when your paranasal sinuses are inflamed, mucus and air can become trapped. This can allow bacteria and other germs to grow and cause infection. Sinusitis can develop quickly and last only a short time (acute) or continue over a long period (chronic). Sinusitis that lasts for more than 12 weeks is considered chronic. CAUSES Causes of sinusitis include:  Allergies.  Structural abnormalities, such as displacement of the cartilage that separates your nostrils (deviated septum), which can decrease the air flow through your nose and sinuses and affect sinus drainage.  Functional abnormalities, such as when the small hairs (cilia) that line your sinuses and help remove mucus do not work properly or are not present. SIGNS AND SYMPTOMS Symptoms of acute and chronic sinusitis are the same. The primary symptoms are pain and pressure around the affected sinuses. Other symptoms include:  Upper toothache.  Earache.  Headache.  Bad breath.  Decreased sense of smell and taste.  A cough, which worsens when you are lying flat.  Fatigue.  Fever.  Thick drainage from your nose, which often is green and may contain pus (purulent).  Swelling and warmth over the affected sinuses. DIAGNOSIS Your health care provider will perform a physical exam. During your exam, your health care provider may perform any of the following to help determine if you have acute sinusitis or chronic sinusitis:  Look in your nose for signs of abnormal growths in your nostrils (nasal polyps).  Tap over the affected sinus to check for signs of  infection.  View the inside of your sinuses using an imaging device that has a light attached (endoscope). If your health care provider suspects that you have chronic sinusitis, one or more of the following tests may be recommended:  Allergy tests.  Nasal culture. A sample of mucus is taken from your nose, sent to a lab, and screened for bacteria.  Nasal cytology. A sample of mucus is taken from your nose and examined by your health care provider to determine if your sinusitis is related to an allergy. TREATMENT Most cases of acute sinusitis are related to a viral infection and will resolve on their own within 10 days. Sometimes, medicines are prescribed to help relieve symptoms of both acute and chronic sinusitis. These may include pain medicines, decongestants, nasal steroid sprays, or saline sprays. However, for sinusitis related to a bacterial infection, your health care provider will prescribe antibiotic medicines. These are medicines that will help kill the bacteria causing the infection. Rarely, sinusitis is caused by a fungal infection. In these cases, your health care provider will prescribe antifungal medicine. For some cases of chronic sinusitis, surgery is needed. Generally, these are cases in which sinusitis recurs more than 3 times per year, despite other treatments. HOME CARE INSTRUCTIONS  Drink plenty of water. Water helps thin the mucus so your sinuses can drain more easily.  Use a humidifier.  Inhale steam 3-4 times a day (for example, sit in the bathroom with the shower running).  Apply a warm, moist washcloth to your face 3-4 times a day, or as directed by your health care provider.  Use saline nasal sprays to help   moisten and clean your sinuses.  Take medicines only as directed by your health care provider.  If you were prescribed either an antibiotic or antifungal medicine, finish it all even if you start to feel better. SEEK IMMEDIATE MEDICAL CARE IF:  You have  increasing pain or severe headaches.  You have nausea, vomiting, or drowsiness.  You have swelling around your face.  You have vision problems.  You have a stiff neck.  You have difficulty breathing.   This information is not intended to replace advice given to you by your health care provider. Make sure you discuss any questions you have with your health care provider.   Document Released: 11/16/2005 Document Revised: 12/07/2014 Document Reviewed: 12/01/2011 Elsevier Interactive Patient Education 2016 [employer]  - Take meds as prescribed - Use a cool mist humidifier  -Use saline nose sprays frequently -Saline irrigations of the nose can be very helpful if done frequently.  * 4X daily for 1 week*  * Use of a nettie pot can be helpful with this. Follow directions with this* -Force fluids -For any cough or congestion  Use plain Mucinex- regular strength or max strength is fine   * Children- consult with Pharmacist for dosing -For fever or aces or pains- take tylenol or ibuprofen appropriate for age and weight.  * for fevers greater than 101 orally you may alternate ibuprofen and tylenol every  3 hours. -Throat lozenges if help   Jomari Bartnik, FNP   

## 2015-10-28 NOTE — Progress Notes (Addendum)
Subjective:    Patient ID: Craig Woodard, male    DOB: 1941/06/06, 74 y.o.   MRN: [id number]  Pt presents to the office today for sinusitis. Pt is followed by the [facility] for his chronic problems.  Sinus Problem This is a new problem. The current episode started 1 to 4 weeks ago. The problem has been gradually worsening since onset. There has been no fever. His pain is at a severity of 8/10. The pain is moderate. Associated symptoms include chills, congestion, coughing, a hoarse voice, shortness of breath, sinus pressure, sneezing and a sore throat. Pertinent negatives include no ear pain. Past treatments include oral decongestants and acetaminophen. The treatment provided mild relief.  Cough This is a new problem. The current episode started 1 to 4 weeks ago. The problem has been gradually worsening. The cough is productive of sputum. Associated symptoms include chills, a sore throat and shortness of breath. Pertinent negatives include no ear pain.      Review of Systems  Constitutional: Positive for chills.  HENT: Positive for congestion, hoarse voice, sinus pressure, sneezing and sore throat. Negative for ear pain.   Respiratory: Positive for cough and shortness of breath.   Cardiovascular: Negative.   Gastrointestinal: Negative.   Endocrine: Negative.   Genitourinary: Negative.   Musculoskeletal: Negative.   Neurological: Negative.   Hematological: Negative.   Psychiatric/Behavioral: Negative.   All other systems reviewed and are negative.      Objective:   Physical Exam  Constitutional: He is oriented to person, place, and time. He appears well-developed and well-nourished. No distress.  HENT:  Head: Normocephalic.  Right Ear: External ear normal.  Left Ear: External ear normal.  Nasal passage erythemas with rhinorrhea   Oropharynx erythemas  Eyes: Pupils are equal, round, and reactive to light. Right eye exhibits no discharge. Left eye exhibits no discharge.  Bilateral  eye lids erythemas with mild swelling   Neck: Normal range of motion. Neck supple. No thyromegaly present.  Cardiovascular: Normal rate, regular rhythm, normal heart sounds and intact distal pulses.   No murmur heard. Pulmonary/Chest: Effort normal and breath sounds normal. No respiratory distress. He has no wheezes.  Abdominal: Soft. Bowel sounds are normal. He exhibits no distension. There is no tenderness.  Musculoskeletal: Normal range of motion. He exhibits no edema or tenderness.  Right BKA  Neurological: He is alert and oriented to person, place, and time. He has normal reflexes. No cranial nerve deficit.  Skin: Skin is warm and dry. No rash noted. No erythema.  Psychiatric: He has a normal mood and affect. His behavior is normal. Judgment and thought content normal.  Vitals reviewed.     BP 143/75 mmHg  Pulse 81  Temp(Src) 97.8 F (36.6 C) (Oral)  Wt      Assessment & Plan:  1. Acute maxillary sinusitis, recurrence not specified -- Take meds as prescribed - Use a cool mist humidifier  -Use saline nose sprays frequently -Saline irrigations of the nose can be very helpful if done frequently.  * 4X daily for 1 week*  * Use of a nettie pot can be helpful with this. Follow directions with this* -Force fluids -For any cough or congestion  Use plain Mucinex- regular strength or max strength is fine   * Children- consult with Pharmacist for dosing -For fever or aces or pains- take tylenol or ibuprofen appropriate for age and weight.  * for fevers greater than 101 orally you may alternate ibuprofen and tylenol every  3 hours. -Throat lozenges if help - amoxicillin-clavulanate (AUGMENTIN) 875-125 MG tablet; Take 1 tablet by mouth 2 (two) times daily.  Dispense: 14 tablet; Refill: 0 - fluticasone (FLONASE) 50 MCG/ACT nasal spray; Place 2 sprays into both nostrils daily.  Dispense: 16 g; Refill: [place], FNP

## 2015-11-04 DIAGNOSIS — J9601 Acute respiratory failure with hypoxia: Secondary | ICD-10-CM | POA: Diagnosis not present

## 2015-11-04 DIAGNOSIS — Z8249 Family history of ischemic heart disease and other diseases of the circulatory system: Secondary | ICD-10-CM | POA: Diagnosis not present

## 2015-11-04 DIAGNOSIS — Z7984 Long term (current) use of oral hypoglycemic drugs: Secondary | ICD-10-CM | POA: Diagnosis not present

## 2015-11-04 DIAGNOSIS — R531 Weakness: Secondary | ICD-10-CM | POA: Diagnosis not present

## 2015-11-04 DIAGNOSIS — Z79899 Other long term (current) drug therapy: Secondary | ICD-10-CM | POA: Diagnosis not present

## 2015-11-04 DIAGNOSIS — R0902 Hypoxemia: Secondary | ICD-10-CM | POA: Diagnosis not present

## 2015-11-04 DIAGNOSIS — Z794 Long term (current) use of insulin: Secondary | ICD-10-CM | POA: Diagnosis not present

## 2015-11-04 DIAGNOSIS — R0602 Shortness of breath: Secondary | ICD-10-CM | POA: Diagnosis not present

## 2015-11-04 DIAGNOSIS — I429 Cardiomyopathy, unspecified: Secondary | ICD-10-CM | POA: Diagnosis not present

## 2015-11-04 DIAGNOSIS — Z87891 Personal history of nicotine dependence: Secondary | ICD-10-CM | POA: Diagnosis not present

## 2015-11-04 DIAGNOSIS — R918 Other nonspecific abnormal finding of lung field: Secondary | ICD-10-CM | POA: Diagnosis not present

## 2015-11-04 DIAGNOSIS — I2510 Atherosclerotic heart disease of native coronary artery without angina pectoris: Secondary | ICD-10-CM | POA: Diagnosis not present

## 2015-11-04 DIAGNOSIS — R779 Abnormality of plasma protein, unspecified: Secondary | ICD-10-CM | POA: Diagnosis not present

## 2015-11-04 DIAGNOSIS — M069 Rheumatoid arthritis, unspecified: Secondary | ICD-10-CM | POA: Diagnosis not present

## 2015-11-04 DIAGNOSIS — R7881 Bacteremia: Secondary | ICD-10-CM | POA: Diagnosis not present

## 2015-11-04 DIAGNOSIS — T8789 Other complications of amputation stump: Secondary | ICD-10-CM | POA: Diagnosis not present

## 2015-11-04 DIAGNOSIS — R197 Diarrhea, unspecified: Secondary | ICD-10-CM | POA: Diagnosis not present

## 2015-11-04 DIAGNOSIS — I10 Essential (primary) hypertension: Secondary | ICD-10-CM | POA: Diagnosis not present

## 2015-11-04 DIAGNOSIS — Z89511 Acquired absence of right leg below knee: Secondary | ICD-10-CM | POA: Diagnosis not present

## 2015-11-04 DIAGNOSIS — I739 Peripheral vascular disease, unspecified: Secondary | ICD-10-CM | POA: Diagnosis not present

## 2015-11-04 DIAGNOSIS — E7800 Pure hypercholesterolemia, unspecified: Secondary | ICD-10-CM | POA: Diagnosis not present

## 2015-11-04 DIAGNOSIS — Z8673 Personal history of transient ischemic attack (TIA), and cerebral infarction without residual deficits: Secondary | ICD-10-CM | POA: Diagnosis not present

## 2015-11-04 DIAGNOSIS — I519 Heart disease, unspecified: Secondary | ICD-10-CM | POA: Diagnosis not present

## 2015-11-04 DIAGNOSIS — R079 Chest pain, unspecified: Secondary | ICD-10-CM | POA: Diagnosis not present

## 2015-11-04 DIAGNOSIS — E1151 Type 2 diabetes mellitus with diabetic peripheral angiopathy without gangrene: Secondary | ICD-10-CM | POA: Diagnosis not present

## 2015-11-04 DIAGNOSIS — I700 Atherosclerosis of aorta: Secondary | ICD-10-CM | POA: Diagnosis not present

## 2015-11-04 DIAGNOSIS — I081 Rheumatic disorders of both mitral and tricuspid valves: Secondary | ICD-10-CM | POA: Diagnosis not present

## 2015-11-04 DIAGNOSIS — J40 Bronchitis, not specified as acute or chronic: Secondary | ICD-10-CM | POA: Diagnosis not present

## 2015-11-04 DIAGNOSIS — E876 Hypokalemia: Secondary | ICD-10-CM | POA: Diagnosis not present

## 2015-11-04 DIAGNOSIS — R7989 Other specified abnormal findings of blood chemistry: Secondary | ICD-10-CM | POA: Diagnosis not present

## 2015-11-04 DIAGNOSIS — I509 Heart failure, unspecified: Secondary | ICD-10-CM | POA: Diagnosis not present

## 2015-11-04 DIAGNOSIS — I517 Cardiomegaly: Secondary | ICD-10-CM | POA: Diagnosis not present

## 2015-11-04 DIAGNOSIS — N400 Enlarged prostate without lower urinary tract symptoms: Secondary | ICD-10-CM | POA: Diagnosis not present

## 2015-11-04 DIAGNOSIS — D3502 Benign neoplasm of left adrenal gland: Secondary | ICD-10-CM | POA: Diagnosis not present

## 2016-01-16 DIAGNOSIS — D509 Iron deficiency anemia, unspecified: Secondary | ICD-10-CM | POA: Diagnosis not present

## 2016-01-16 DIAGNOSIS — E119 Type 2 diabetes mellitus without complications: Secondary | ICD-10-CM | POA: Diagnosis not present

## 2016-01-16 DIAGNOSIS — J069 Acute upper respiratory infection, unspecified: Secondary | ICD-10-CM | POA: Diagnosis not present

## 2016-01-16 DIAGNOSIS — D696 Thrombocytopenia, unspecified: Secondary | ICD-10-CM | POA: Diagnosis not present

## 2016-01-16 DIAGNOSIS — M0570 Rheumatoid arthritis with rheumatoid factor of unspecified site without organ or systems involvement: Secondary | ICD-10-CM | POA: Diagnosis not present

## 2016-01-22 ENCOUNTER — Other Ambulatory Visit (INDEPENDENT_AMBULATORY_CARE_PROVIDER_SITE_OTHER): Payer: Medicare Other

## 2016-01-22 DIAGNOSIS — M0570 Rheumatoid arthritis with rheumatoid factor of unspecified site without organ or systems involvement: Secondary | ICD-10-CM

## 2016-01-22 DIAGNOSIS — Z79899 Other long term (current) drug therapy: Secondary | ICD-10-CM

## 2016-01-22 NOTE — Progress Notes (Signed)
Lab work for Dr Charlestine Night CMP, CBC Mo05.70, z79.899 Sent out on [facility] paper order

## 2020-05-30 DEATH — deceased
